# Patient Record
Sex: Male | Born: 1940
Health system: Southern US, Community
[De-identification: ages and names within clinical notes are randomized; demographics above are authoritative.]

## PROBLEM LIST (undated history)

## (undated) DIAGNOSIS — I739 Peripheral vascular disease, unspecified: Secondary | ICD-10-CM

## (undated) DIAGNOSIS — I10 Essential (primary) hypertension: Secondary | ICD-10-CM

## (undated) HISTORY — DX: Peripheral vascular disease, unspecified: I73.9

## (undated) HISTORY — PX: TONSILLECTOMY: SUR1361

---

## 1999-01-22 ENCOUNTER — Emergency Department (HOSPITAL_COMMUNITY): Admission: EM | Admit: 1999-01-22 | Discharge: 1999-01-22 | Payer: Self-pay | Admitting: Emergency Medicine

## 2004-05-16 ENCOUNTER — Ambulatory Visit (HOSPITAL_COMMUNITY): Admission: RE | Admit: 2004-05-16 | Discharge: 2004-05-16 | Payer: Self-pay | Admitting: Gastroenterology

## 2011-02-06 ENCOUNTER — Emergency Department (HOSPITAL_COMMUNITY)
Admission: EM | Admit: 2011-02-06 | Discharge: 2011-02-06 | Disposition: A | Discharge status: Home / Self Care | Payer: Medicare Other | Attending: Emergency Medicine | Admitting: Emergency Medicine

## 2011-02-06 ENCOUNTER — Telehealth: Payer: Self-pay | Admitting: Internal Medicine

## 2011-02-06 DIAGNOSIS — R1310 Dysphagia, unspecified: Secondary | ICD-10-CM | POA: Insufficient documentation

## 2011-02-06 DIAGNOSIS — K2980 Duodenitis without bleeding: Secondary | ICD-10-CM | POA: Insufficient documentation

## 2011-02-06 DIAGNOSIS — IMO0002 Reserved for concepts with insufficient information to code with codable children: Secondary | ICD-10-CM | POA: Insufficient documentation

## 2011-02-06 DIAGNOSIS — I10 Essential (primary) hypertension: Secondary | ICD-10-CM | POA: Insufficient documentation

## 2011-02-06 DIAGNOSIS — T18108A Unspecified foreign body in esophagus causing other injury, initial encounter: Secondary | ICD-10-CM | POA: Insufficient documentation

## 2011-02-06 LAB — POCT I-STAT, CHEM 8
BUN: 25 mg/dL — ABNORMAL HIGH (ref 6–23)
Calcium, Ion: 1.12 mmol/L (ref 1.12–1.32)
Chloride: 109 mEq/L (ref 96–112)
Creatinine, Ser: 1.2 mg/dL (ref 0.50–1.35)
Glucose, Bld: 106 mg/dL — ABNORMAL HIGH (ref 70–99)
HCT: 44 % (ref 39.0–52.0)
Hemoglobin: 15 g/dL (ref 13.0–17.0)
Potassium: 4.4 mEq/L (ref 3.5–5.1)
Sodium: 143 mEq/L (ref 135–145)
TCO2: 27 mmol/L (ref 0–100)

## 2011-02-06 LAB — DIFFERENTIAL
Basophils Absolute: 0.1 10*3/uL (ref 0.0–0.1)
Basophils Relative: 1 % (ref 0–1)
Eosinophils Absolute: 0.2 10*3/uL (ref 0.0–0.7)
Eosinophils Relative: 2 % (ref 0–5)
Lymphocytes Relative: 14 % (ref 12–46)
Lymphs Abs: 1.2 10*3/uL (ref 0.7–4.0)
Monocytes Absolute: 0.6 10*3/uL (ref 0.1–1.0)
Monocytes Relative: 6 % (ref 3–12)
Neutro Abs: 6.6 10*3/uL (ref 1.7–7.7)
Neutrophils Relative %: 77 % (ref 43–77)

## 2011-02-06 LAB — CBC
HCT: 41.2 % (ref 39.0–52.0)
Hemoglobin: 14.5 g/dL (ref 13.0–17.0)
MCH: 32.4 pg (ref 26.0–34.0)
MCHC: 35.2 g/dL (ref 30.0–36.0)
MCV: 92.2 fL (ref 78.0–100.0)
Platelets: 208 10*3/uL (ref 150–400)
RBC: 4.47 MIL/uL (ref 4.22–5.81)
RDW: 14.4 % (ref 11.5–15.5)
WBC: 8.6 10*3/uL (ref 4.0–10.5)

## 2011-02-06 LAB — PROTIME-INR: Prothrombin Time: 12.7 seconds (ref 11.6–15.2)

## 2011-02-06 NOTE — Telephone Encounter (Signed)
Pt is complaining of having food stuck in his esophagus. Pt was seen in 2005 by Dr. Dorena Cookey with [facility]. Spoke with pts wife and let her know he had not been seen by one of our physicians but by Dr. Madilyn Fireman. Gave wife the phone number for [facility]. She states she will call their office.

## 2011-02-21 ENCOUNTER — Ambulatory Visit (HOSPITAL_COMMUNITY)
Admission: RE | Admit: 2011-02-21 | Discharge: 2011-02-21 | Disposition: A | Discharge status: Home / Self Care | Payer: Medicare Other | Source: Ambulatory Visit | Attending: Gastroenterology | Admitting: Gastroenterology

## 2011-02-21 DIAGNOSIS — R1310 Dysphagia, unspecified: Secondary | ICD-10-CM | POA: Insufficient documentation

## 2011-02-21 DIAGNOSIS — Q393 Congenital stenosis and stricture of esophagus: Secondary | ICD-10-CM | POA: Insufficient documentation

## 2011-02-21 DIAGNOSIS — K222 Esophageal obstruction: Secondary | ICD-10-CM | POA: Insufficient documentation

## 2011-02-21 DIAGNOSIS — K2980 Duodenitis without bleeding: Secondary | ICD-10-CM | POA: Insufficient documentation

## 2011-02-21 DIAGNOSIS — Q391 Atresia of esophagus with tracheo-esophageal fistula: Secondary | ICD-10-CM | POA: Insufficient documentation

## 2011-02-21 NOTE — Op Note (Signed)
  NAME:  Ronald [name], Ronald [name] NO.:  [id number]  MEDICAL RECORD NO.:  [id number]  LOCATION:  [facility]                         FACILITY:  [facility]  PHYSICIAN:  Shirley Friar, MDDATE OF BIRTH:  [birth year]  DATE OF PROCEDURE: DATE OF DISCHARGE:                              OPERATIVE REPORT   INDICATIONS:  Concern for food impaction and a history of dysphagia.  MEDICATIONS:  Fentanyl 75 mcg IV, Versed 7 mg IV.  FINDINGS:  Endoscope was inserted through oropharynx and esophagus intubated.  In the distal esophagus was a benign-appearing distal esophageal ring at GE junction with edema, erythema and ulceration noted, most likely due to trauma from the food bolus which has passed. The remaining part of esophagus was unremarkable.  The endoscope was advanced easily down to the stomach without any resistance into the stomach which was unremarkable.  Retroflexed view of the stomach revealed normal proximal stomach.  Endoscope was straightened advanced to the duodenal bulb which revealed scattered nodularity with erythema consistent with mild to moderate duodenitis.  Endoscope was advanced into the second portion of the duodenum which was unremarkable. Endoscope was withdrawn back into the esophagus and the distal esophageal ring was again reevaluated and the endoscope was withdrawn to confirm above findings.  ASSESSMENT: 1. Distal esophageal ring status post food impaction which cleared     prior to endoscopy. 2. Duodenitis.  PLAN: 1. Start PPI therapy. 2. Follow up endoscopy with dilation in 2 weeks.     Shirley Friar, MD     VCS/MEDQ  D:  02/06/2011  T:  02/07/2011  Job:  [id number]  Electronically Signed by Charlott Rakes MD on 02/21/2011 10:58:54 AM

## 2011-02-23 ENCOUNTER — Encounter (INDEPENDENT_AMBULATORY_CARE_PROVIDER_SITE_OTHER): Payer: Medicare Other | Admitting: Ophthalmology

## 2011-02-23 DIAGNOSIS — H2510 Age-related nuclear cataract, unspecified eye: Secondary | ICD-10-CM

## 2011-02-23 DIAGNOSIS — H3530 Unspecified macular degeneration: Secondary | ICD-10-CM

## 2011-02-23 DIAGNOSIS — H43819 Vitreous degeneration, unspecified eye: Secondary | ICD-10-CM

## 2011-03-19 NOTE — Op Note (Signed)
  NAME:  Ronald [name], Ronald [name] NO.:  [id number]  MEDICAL RECORD NO.:  [id number]  LOCATION:  [facility]                         FACILITY:  [facility]  PHYSICIAN:  Shirley Friar, MDDATE OF BIRTH:  08-May-1941  DATE OF PROCEDURE: DATE OF DISCHARGE:                              OPERATIVE REPORT   PROCEDURE:  Upper endoscopy.  INDICATION:  Dysphagia, recent food impaction, and finding of the distal esophageal ring.  MEDICATIONS:  Fentanyl 75 mcg IV, Versed 8 mg IV, Cetacaine spray x2.  FINDINGS:  Endoscope was inserted through oropharynx and esophagus was intubated which revealed a distal esophageal ring that was partially obstructing at the GE junction which was approximately 42 cm from the incisors.  Endoscope was advanced through this area with mild resistance down into the stomach which revealed normal-appearing gastric mucosa. Retroflexion was done which revealed normal proximal stomach.  Endoscope was straightened and advanced into the duodenal bulb which revealed scattered areas of erythema in the duodenal bulb, consistent with mild duodenitis.  Endoscope was advanced down to the 2nd portion of duodenum which was unremarkable.  Endoscope was withdrawn back into the stomach back to the esophagus where there was evidence of endoscopic trauma at the distal esophageal ring from passage of the endoscope.  An 8 cm long pneumatic balloon through the scope was inserted and inflated to 15 mm and this size was held for 1 minute.  The balloon was deflated and the area was observed and there was evidence of blood at the site of the ring.  The balloon was then inflated to 16.5 mm and held for 1 minute and then the balloon was deflated and removed.  The distal esophageal ring was successfully dilated.  In the middle part of the esophagus was a nonobstructing esophageal web noted.  Endoscope was drawn to conform above findings.  ASSESSMENT: 1. Distal esophageal ring, status post  balloon dilation up to 16.5 mm. 2. Nonobstructing mid esophageal web. 3. Mild duodenitis.  PLAN: 1. Advance diet as tolerated. 2. Continue daily proton pump inhibitor therapy. 3. Follow up in office as needed.     Shirley Friar, MD     VCS/MEDQ  D:  02/21/2011  T:  02/21/2011  Job:  [id number]  cc:   Duncan Dull, M.D. Fax: [phone]  Electronically Signed by Charlott Rakes MD on 03/19/2011 07:52:32 PM

## 2011-05-30 ENCOUNTER — Other Ambulatory Visit: Payer: Self-pay | Admitting: Family Medicine

## 2011-05-30 DIAGNOSIS — Z136 Encounter for screening for cardiovascular disorders: Secondary | ICD-10-CM

## 2011-06-06 ENCOUNTER — Ambulatory Visit
Admission: RE | Admit: 2011-06-06 | Discharge: 2011-06-06 | Disposition: A | Discharge status: Home / Self Care | Payer: Medicare Other | Source: Ambulatory Visit | Attending: Family Medicine | Admitting: Family Medicine

## 2011-06-06 DIAGNOSIS — Z136 Encounter for screening for cardiovascular disorders: Secondary | ICD-10-CM | POA: Diagnosis not present

## 2011-08-09 DIAGNOSIS — Z79899 Other long term (current) drug therapy: Secondary | ICD-10-CM | POA: Diagnosis not present

## 2011-08-09 DIAGNOSIS — R609 Edema, unspecified: Secondary | ICD-10-CM | POA: Diagnosis not present

## 2011-08-09 DIAGNOSIS — R5381 Other malaise: Secondary | ICD-10-CM | POA: Diagnosis not present

## 2011-08-09 DIAGNOSIS — R5383 Other fatigue: Secondary | ICD-10-CM | POA: Diagnosis not present

## 2011-08-09 DIAGNOSIS — B351 Tinea unguium: Secondary | ICD-10-CM | POA: Diagnosis not present

## 2011-11-15 DIAGNOSIS — H2510 Age-related nuclear cataract, unspecified eye: Secondary | ICD-10-CM | POA: Diagnosis not present

## 2011-11-15 DIAGNOSIS — H40059 Ocular hypertension, unspecified eye: Secondary | ICD-10-CM | POA: Diagnosis not present

## 2011-11-15 DIAGNOSIS — H04129 Dry eye syndrome of unspecified lacrimal gland: Secondary | ICD-10-CM | POA: Diagnosis not present

## 2012-02-22 ENCOUNTER — Encounter (INDEPENDENT_AMBULATORY_CARE_PROVIDER_SITE_OTHER): Payer: Medicare Other | Admitting: Ophthalmology

## 2012-02-25 ENCOUNTER — Encounter (INDEPENDENT_AMBULATORY_CARE_PROVIDER_SITE_OTHER): Payer: Medicare Other | Admitting: Ophthalmology

## 2012-03-03 ENCOUNTER — Encounter (INDEPENDENT_AMBULATORY_CARE_PROVIDER_SITE_OTHER): Payer: Medicare Other | Admitting: Ophthalmology

## 2012-03-18 DIAGNOSIS — Z23 Encounter for immunization: Secondary | ICD-10-CM | POA: Diagnosis not present

## 2012-05-15 DIAGNOSIS — H04129 Dry eye syndrome of unspecified lacrimal gland: Secondary | ICD-10-CM | POA: Diagnosis not present

## 2012-05-15 DIAGNOSIS — H2510 Age-related nuclear cataract, unspecified eye: Secondary | ICD-10-CM | POA: Diagnosis not present

## 2012-05-15 DIAGNOSIS — H3530 Unspecified macular degeneration: Secondary | ICD-10-CM | POA: Diagnosis not present

## 2012-05-15 DIAGNOSIS — H40059 Ocular hypertension, unspecified eye: Secondary | ICD-10-CM | POA: Diagnosis not present

## 2012-06-26 DIAGNOSIS — H35369 Drusen (degenerative) of macula, unspecified eye: Secondary | ICD-10-CM | POA: Diagnosis not present

## 2012-06-26 DIAGNOSIS — H40059 Ocular hypertension, unspecified eye: Secondary | ICD-10-CM | POA: Diagnosis not present

## 2012-06-26 DIAGNOSIS — H04129 Dry eye syndrome of unspecified lacrimal gland: Secondary | ICD-10-CM | POA: Diagnosis not present

## 2012-06-26 DIAGNOSIS — H2510 Age-related nuclear cataract, unspecified eye: Secondary | ICD-10-CM | POA: Diagnosis not present

## 2012-07-28 DIAGNOSIS — L570 Actinic keratosis: Secondary | ICD-10-CM | POA: Diagnosis not present

## 2012-07-28 DIAGNOSIS — L538 Other specified erythematous conditions: Secondary | ICD-10-CM | POA: Diagnosis not present

## 2012-07-28 DIAGNOSIS — B351 Tinea unguium: Secondary | ICD-10-CM | POA: Diagnosis not present

## 2012-07-28 DIAGNOSIS — D235 Other benign neoplasm of skin of trunk: Secondary | ICD-10-CM | POA: Diagnosis not present

## 2012-08-07 DIAGNOSIS — H04129 Dry eye syndrome of unspecified lacrimal gland: Secondary | ICD-10-CM | POA: Diagnosis not present

## 2012-08-07 DIAGNOSIS — H2510 Age-related nuclear cataract, unspecified eye: Secondary | ICD-10-CM | POA: Diagnosis not present

## 2012-08-07 DIAGNOSIS — H40059 Ocular hypertension, unspecified eye: Secondary | ICD-10-CM | POA: Diagnosis not present

## 2012-12-22 DIAGNOSIS — L259 Unspecified contact dermatitis, unspecified cause: Secondary | ICD-10-CM | POA: Diagnosis not present

## 2012-12-22 DIAGNOSIS — M79609 Pain in unspecified limb: Secondary | ICD-10-CM | POA: Diagnosis not present

## 2012-12-22 DIAGNOSIS — N529 Male erectile dysfunction, unspecified: Secondary | ICD-10-CM | POA: Diagnosis not present

## 2012-12-22 DIAGNOSIS — I10 Essential (primary) hypertension: Secondary | ICD-10-CM | POA: Diagnosis not present

## 2013-02-12 DIAGNOSIS — H40059 Ocular hypertension, unspecified eye: Secondary | ICD-10-CM | POA: Diagnosis not present

## 2013-02-12 DIAGNOSIS — H2510 Age-related nuclear cataract, unspecified eye: Secondary | ICD-10-CM | POA: Diagnosis not present

## 2013-02-12 DIAGNOSIS — H353190 Nonexudative age-related macular degeneration, unspecified eye, stage unspecified: Secondary | ICD-10-CM | POA: Diagnosis not present

## 2013-02-27 ENCOUNTER — Ambulatory Visit (INDEPENDENT_AMBULATORY_CARE_PROVIDER_SITE_OTHER): Payer: Medicare Other | Admitting: Ophthalmology

## 2013-02-27 DIAGNOSIS — H3530 Unspecified macular degeneration: Secondary | ICD-10-CM

## 2013-02-27 DIAGNOSIS — H2510 Age-related nuclear cataract, unspecified eye: Secondary | ICD-10-CM

## 2013-02-27 DIAGNOSIS — I10 Essential (primary) hypertension: Secondary | ICD-10-CM | POA: Diagnosis not present

## 2013-02-27 DIAGNOSIS — H35039 Hypertensive retinopathy, unspecified eye: Secondary | ICD-10-CM | POA: Diagnosis not present

## 2013-02-27 DIAGNOSIS — H43819 Vitreous degeneration, unspecified eye: Secondary | ICD-10-CM | POA: Diagnosis not present

## 2013-03-05 DIAGNOSIS — Z23 Encounter for immunization: Secondary | ICD-10-CM | POA: Diagnosis not present

## 2013-05-18 DIAGNOSIS — I872 Venous insufficiency (chronic) (peripheral): Secondary | ICD-10-CM | POA: Diagnosis not present

## 2013-05-18 DIAGNOSIS — D235 Other benign neoplasm of skin of trunk: Secondary | ICD-10-CM | POA: Diagnosis not present

## 2013-05-18 DIAGNOSIS — L259 Unspecified contact dermatitis, unspecified cause: Secondary | ICD-10-CM | POA: Diagnosis not present

## 2013-08-13 DIAGNOSIS — H40059 Ocular hypertension, unspecified eye: Secondary | ICD-10-CM | POA: Diagnosis not present

## 2013-08-13 DIAGNOSIS — H2510 Age-related nuclear cataract, unspecified eye: Secondary | ICD-10-CM | POA: Diagnosis not present

## 2013-08-13 DIAGNOSIS — H353190 Nonexudative age-related macular degeneration, unspecified eye, stage unspecified: Secondary | ICD-10-CM | POA: Diagnosis not present

## 2013-09-11 IMAGING — US US AORTA SCREENING (MEDICARE)
1 series · 14 of 14 positions shown · non-contrast
Comparison: None.

CLINICAL DATA: Screening examination.  Hypertension.

ULTRASOUND OF ABDOMINAL AORTA
TECHNIQUE: Ultrasound examination of the abdominal aorta was
performed to evaluate for abdominal aortic aneurysm.

[Series 1: us aorta screening (medicare) · 0.35mm/px · 14 of 14 slices shown]
[im 1/14]
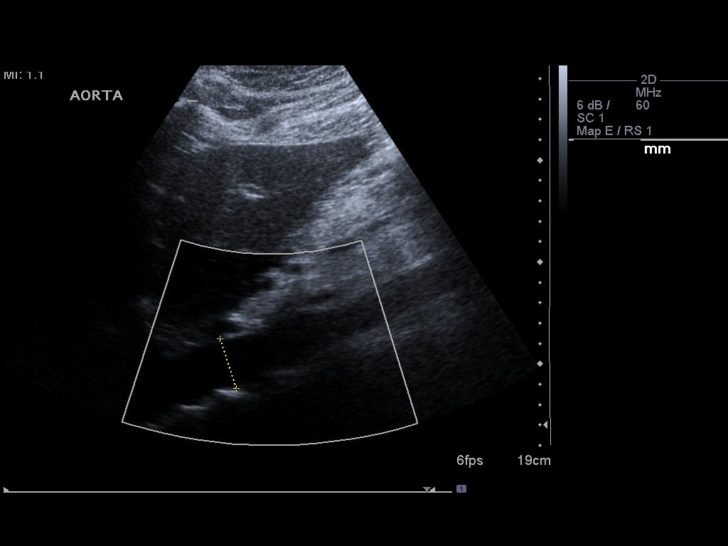
[im 2/14]
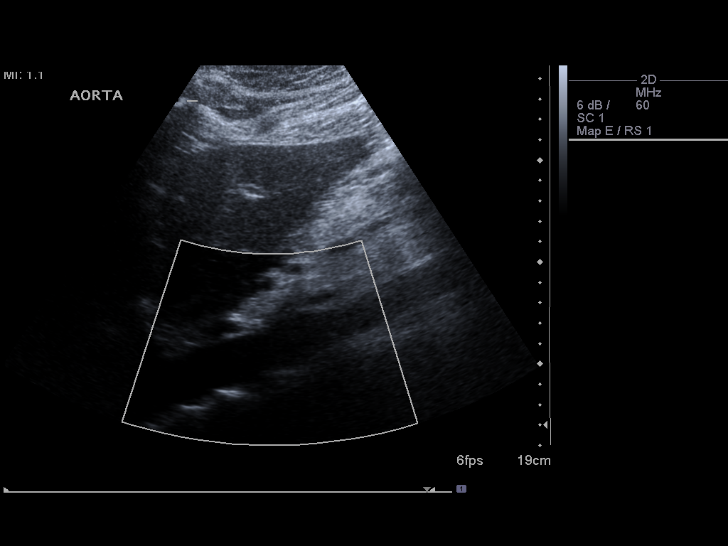
[im 3/14]
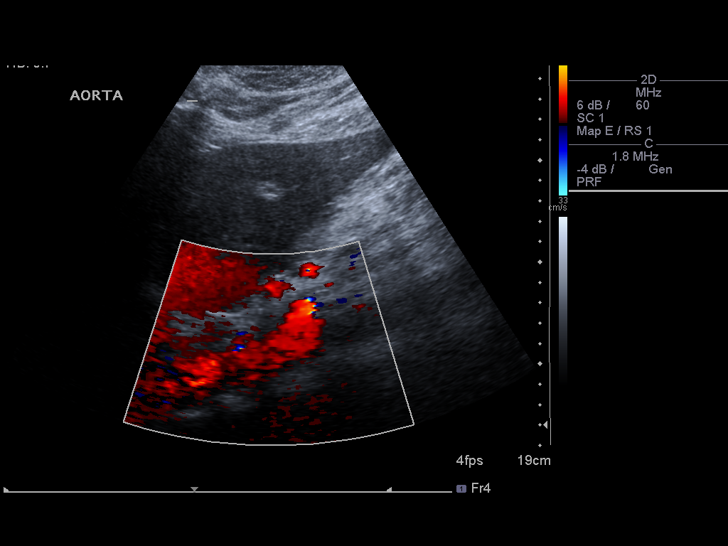
[im 4/14]
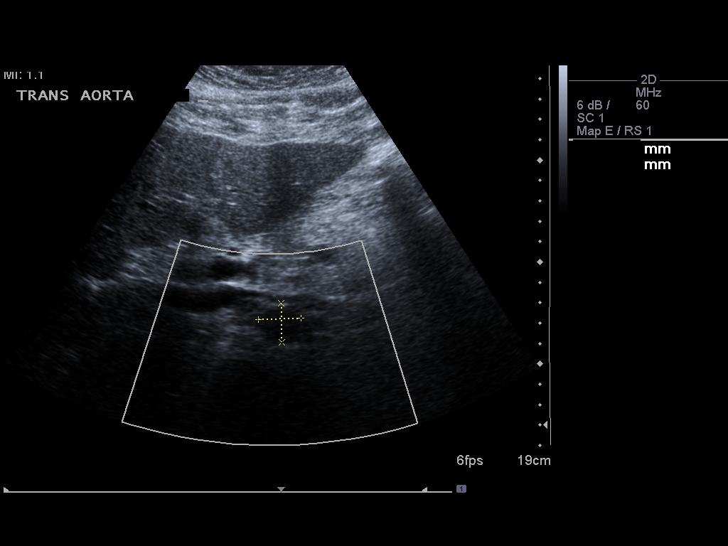
[im 5/14]
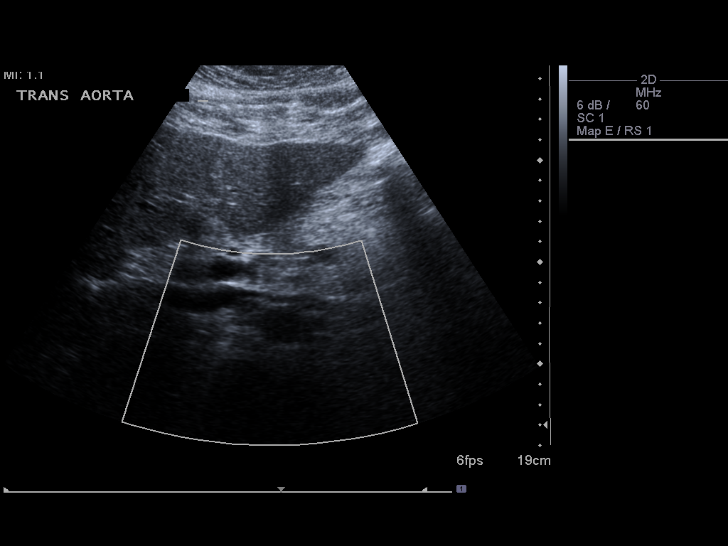
[im 6/14]
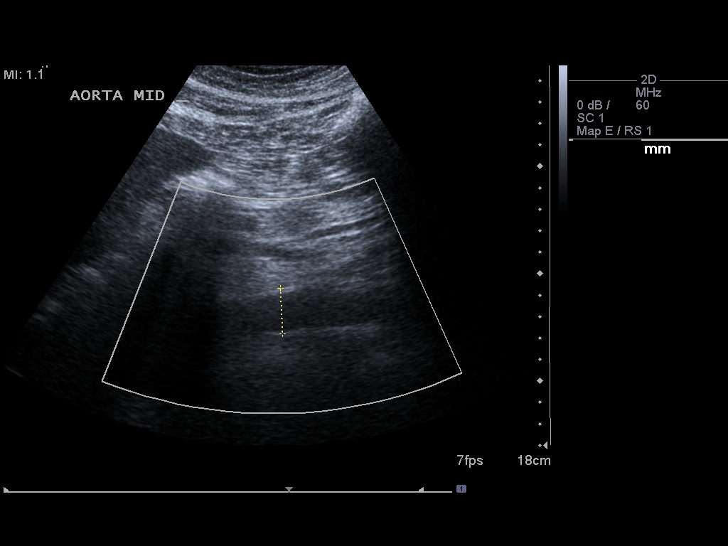
[im 7/14]
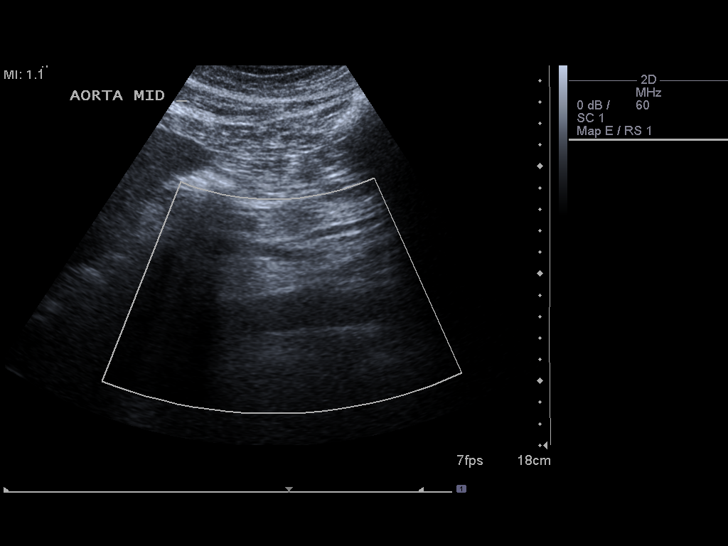
[im 8/14]
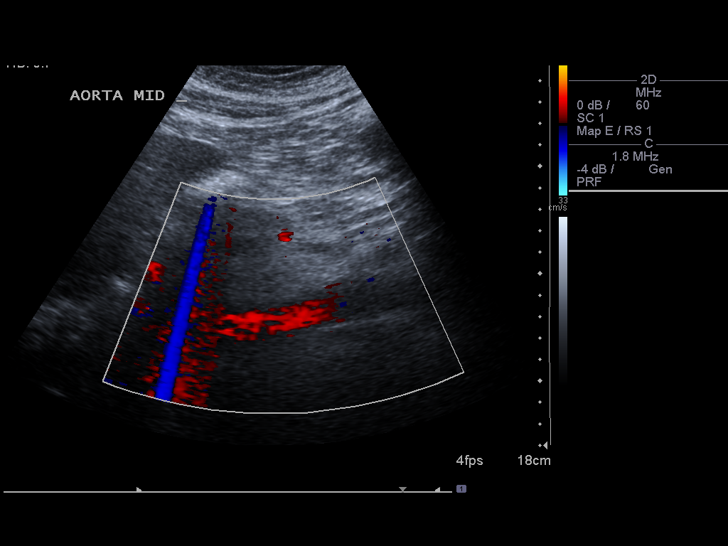
[im 9/14]
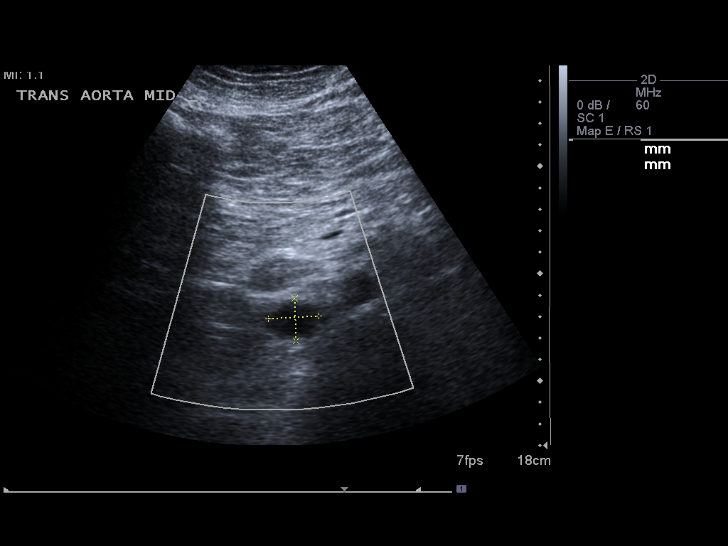
[im 10/14]
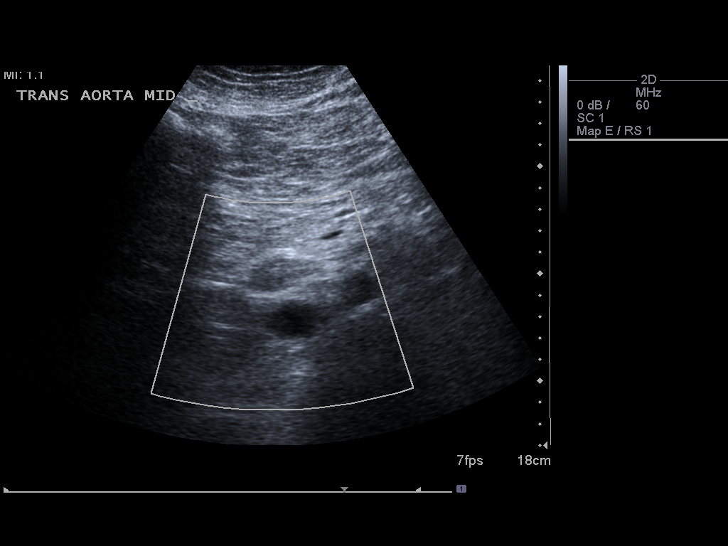
[im 11/14]
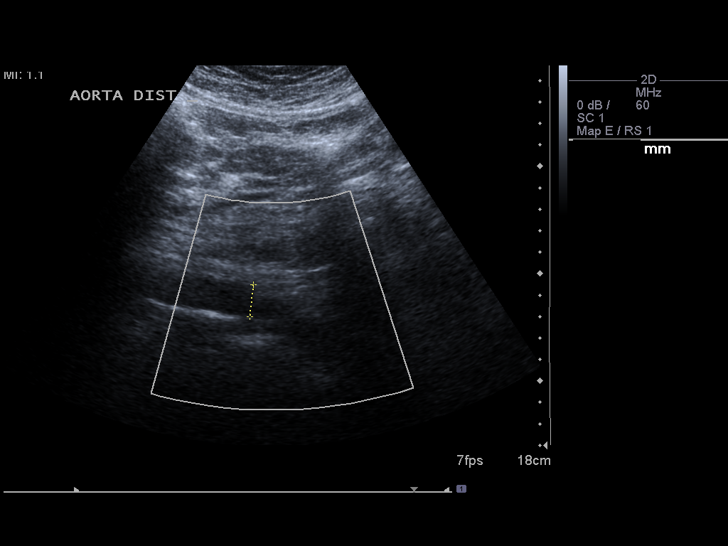
[im 12/14]
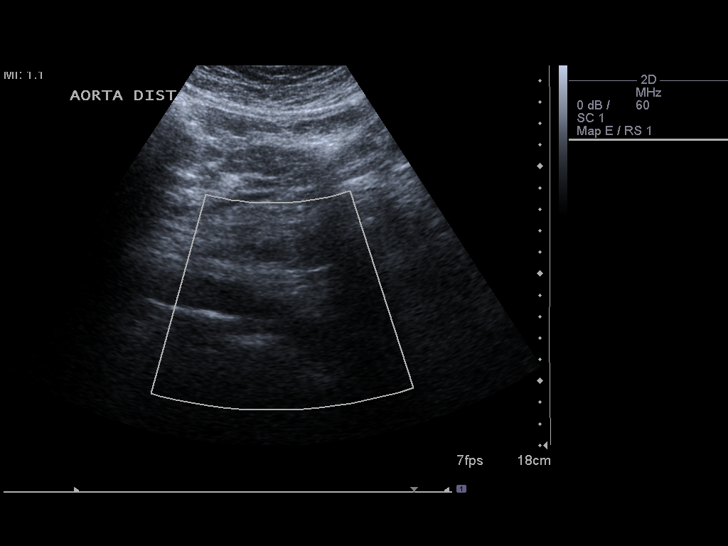
[im 13/14]
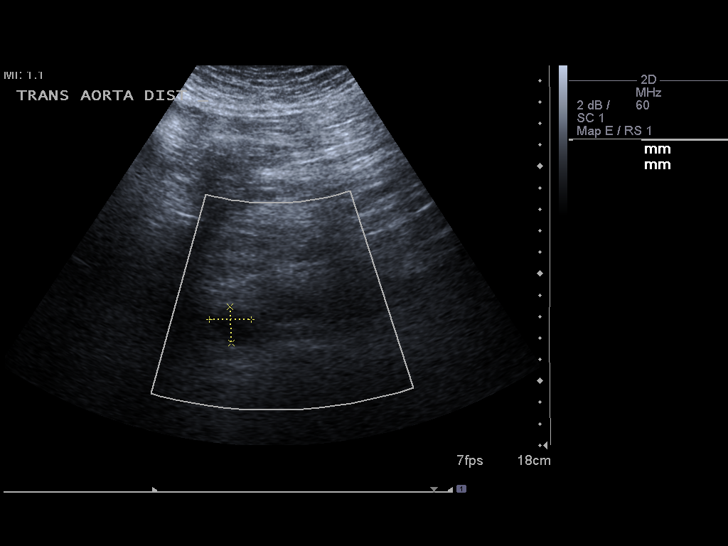
[im 14/14]
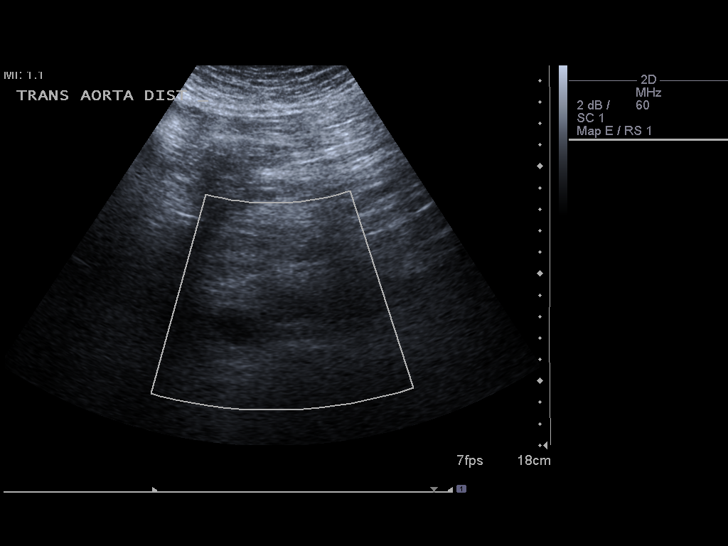

[14 of 14 positions shown; findings below may reference images not displayed]

Abdominal Aorta:  No aneurysm identified.

      Maximum AP diameter:  2.6 cm.
      Maximum TRV diameter:  2.1 cm.
IMPRESSION: No abdominal aortic aneurysm identified.

## 2014-02-15 DIAGNOSIS — Z125 Encounter for screening for malignant neoplasm of prostate: Secondary | ICD-10-CM | POA: Diagnosis not present

## 2014-02-15 DIAGNOSIS — Z136 Encounter for screening for cardiovascular disorders: Secondary | ICD-10-CM | POA: Diagnosis not present

## 2014-02-15 DIAGNOSIS — I10 Essential (primary) hypertension: Secondary | ICD-10-CM | POA: Diagnosis not present

## 2014-02-15 DIAGNOSIS — Z23 Encounter for immunization: Secondary | ICD-10-CM | POA: Diagnosis not present

## 2014-02-15 DIAGNOSIS — Z0000 Encounter for general adult medical examination without abnormal findings: Secondary | ICD-10-CM | POA: Diagnosis not present

## 2014-02-15 DIAGNOSIS — Z1211 Encounter for screening for malignant neoplasm of colon: Secondary | ICD-10-CM | POA: Diagnosis not present

## 2014-02-16 DIAGNOSIS — Z23 Encounter for immunization: Secondary | ICD-10-CM | POA: Diagnosis not present

## 2014-02-18 DIAGNOSIS — H40059 Ocular hypertension, unspecified eye: Secondary | ICD-10-CM | POA: Diagnosis not present

## 2014-02-18 DIAGNOSIS — H2510 Age-related nuclear cataract, unspecified eye: Secondary | ICD-10-CM | POA: Diagnosis not present

## 2014-02-18 DIAGNOSIS — H353190 Nonexudative age-related macular degeneration, unspecified eye, stage unspecified: Secondary | ICD-10-CM | POA: Diagnosis not present

## 2014-03-12 ENCOUNTER — Ambulatory Visit (INDEPENDENT_AMBULATORY_CARE_PROVIDER_SITE_OTHER): Payer: Medicare Other | Admitting: Ophthalmology

## 2014-03-12 DIAGNOSIS — I10 Essential (primary) hypertension: Secondary | ICD-10-CM

## 2014-03-12 DIAGNOSIS — H3531 Nonexudative age-related macular degeneration: Secondary | ICD-10-CM

## 2014-03-12 DIAGNOSIS — H35033 Hypertensive retinopathy, bilateral: Secondary | ICD-10-CM

## 2014-03-12 DIAGNOSIS — H43813 Vitreous degeneration, bilateral: Secondary | ICD-10-CM

## 2014-03-15 DIAGNOSIS — H6120 Impacted cerumen, unspecified ear: Secondary | ICD-10-CM | POA: Diagnosis not present

## 2014-03-15 DIAGNOSIS — L309 Dermatitis, unspecified: Secondary | ICD-10-CM | POA: Diagnosis not present

## 2014-03-22 DIAGNOSIS — B351 Tinea unguium: Secondary | ICD-10-CM | POA: Diagnosis not present

## 2014-03-22 DIAGNOSIS — Z1283 Encounter for screening for malignant neoplasm of skin: Secondary | ICD-10-CM | POA: Diagnosis not present

## 2014-03-22 DIAGNOSIS — L570 Actinic keratosis: Secondary | ICD-10-CM | POA: Diagnosis not present

## 2014-03-22 DIAGNOSIS — L259 Unspecified contact dermatitis, unspecified cause: Secondary | ICD-10-CM | POA: Diagnosis not present

## 2014-03-22 DIAGNOSIS — X32XXXD Exposure to sunlight, subsequent encounter: Secondary | ICD-10-CM | POA: Diagnosis not present

## 2014-05-31 DIAGNOSIS — D120 Benign neoplasm of cecum: Secondary | ICD-10-CM | POA: Diagnosis not present

## 2014-05-31 DIAGNOSIS — Z1211 Encounter for screening for malignant neoplasm of colon: Secondary | ICD-10-CM | POA: Diagnosis not present

## 2014-05-31 DIAGNOSIS — D126 Benign neoplasm of colon, unspecified: Secondary | ICD-10-CM | POA: Diagnosis not present

## 2014-08-25 DIAGNOSIS — H3531 Nonexudative age-related macular degeneration: Secondary | ICD-10-CM | POA: Diagnosis not present

## 2014-08-25 DIAGNOSIS — H02834 Dermatochalasis of left upper eyelid: Secondary | ICD-10-CM | POA: Diagnosis not present

## 2014-08-25 DIAGNOSIS — H25813 Combined forms of age-related cataract, bilateral: Secondary | ICD-10-CM | POA: Diagnosis not present

## 2014-08-25 DIAGNOSIS — H02831 Dermatochalasis of right upper eyelid: Secondary | ICD-10-CM | POA: Diagnosis not present

## 2014-08-25 DIAGNOSIS — H40053 Ocular hypertension, bilateral: Secondary | ICD-10-CM | POA: Diagnosis not present

## 2014-09-13 DIAGNOSIS — H2512 Age-related nuclear cataract, left eye: Secondary | ICD-10-CM | POA: Diagnosis not present

## 2014-09-13 DIAGNOSIS — H3531 Nonexudative age-related macular degeneration: Secondary | ICD-10-CM | POA: Diagnosis not present

## 2014-09-13 DIAGNOSIS — H25812 Combined forms of age-related cataract, left eye: Secondary | ICD-10-CM | POA: Diagnosis not present

## 2014-09-21 DIAGNOSIS — H2511 Age-related nuclear cataract, right eye: Secondary | ICD-10-CM | POA: Diagnosis not present

## 2014-09-27 DIAGNOSIS — H2511 Age-related nuclear cataract, right eye: Secondary | ICD-10-CM | POA: Diagnosis not present

## 2014-09-27 DIAGNOSIS — H269 Unspecified cataract: Secondary | ICD-10-CM | POA: Diagnosis not present

## 2014-10-06 DIAGNOSIS — H3531 Nonexudative age-related macular degeneration: Secondary | ICD-10-CM | POA: Diagnosis not present

## 2014-11-10 DIAGNOSIS — H3531 Nonexudative age-related macular degeneration: Secondary | ICD-10-CM | POA: Diagnosis not present

## 2014-11-10 DIAGNOSIS — H26493 Other secondary cataract, bilateral: Secondary | ICD-10-CM | POA: Diagnosis not present

## 2014-12-22 DIAGNOSIS — H40053 Ocular hypertension, bilateral: Secondary | ICD-10-CM | POA: Diagnosis not present

## 2014-12-22 DIAGNOSIS — Z961 Presence of intraocular lens: Secondary | ICD-10-CM | POA: Diagnosis not present

## 2014-12-22 DIAGNOSIS — H3531 Nonexudative age-related macular degeneration: Secondary | ICD-10-CM | POA: Diagnosis not present

## 2015-02-21 DIAGNOSIS — Z136 Encounter for screening for cardiovascular disorders: Secondary | ICD-10-CM | POA: Diagnosis not present

## 2015-02-21 DIAGNOSIS — Z23 Encounter for immunization: Secondary | ICD-10-CM | POA: Diagnosis not present

## 2015-02-21 DIAGNOSIS — I10 Essential (primary) hypertension: Secondary | ICD-10-CM | POA: Diagnosis not present

## 2015-02-21 DIAGNOSIS — Z0000 Encounter for general adult medical examination without abnormal findings: Secondary | ICD-10-CM | POA: Diagnosis not present

## 2015-03-10 DIAGNOSIS — I10 Essential (primary) hypertension: Secondary | ICD-10-CM | POA: Diagnosis not present

## 2015-04-04 ENCOUNTER — Ambulatory Visit (INDEPENDENT_AMBULATORY_CARE_PROVIDER_SITE_OTHER): Payer: Medicare Other | Admitting: Ophthalmology

## 2015-04-04 DIAGNOSIS — H43813 Vitreous degeneration, bilateral: Secondary | ICD-10-CM

## 2015-04-04 DIAGNOSIS — H35033 Hypertensive retinopathy, bilateral: Secondary | ICD-10-CM | POA: Diagnosis not present

## 2015-04-04 DIAGNOSIS — H353134 Nonexudative age-related macular degeneration, bilateral, advanced atrophic with subfoveal involvement: Secondary | ICD-10-CM | POA: Diagnosis not present

## 2015-04-04 DIAGNOSIS — I10 Essential (primary) hypertension: Secondary | ICD-10-CM | POA: Diagnosis not present

## 2015-05-16 ENCOUNTER — Encounter (INDEPENDENT_AMBULATORY_CARE_PROVIDER_SITE_OTHER): Payer: Medicare Other | Admitting: Ophthalmology

## 2015-05-16 DIAGNOSIS — I10 Essential (primary) hypertension: Secondary | ICD-10-CM | POA: Diagnosis not present

## 2015-05-16 DIAGNOSIS — H59033 Cystoid macular edema following cataract surgery, bilateral: Secondary | ICD-10-CM | POA: Diagnosis not present

## 2015-05-16 DIAGNOSIS — H35033 Hypertensive retinopathy, bilateral: Secondary | ICD-10-CM | POA: Diagnosis not present

## 2015-05-16 DIAGNOSIS — H43813 Vitreous degeneration, bilateral: Secondary | ICD-10-CM

## 2015-05-16 DIAGNOSIS — H353134 Nonexudative age-related macular degeneration, bilateral, advanced atrophic with subfoveal involvement: Secondary | ICD-10-CM | POA: Diagnosis not present

## 2015-05-18 ENCOUNTER — Encounter (INDEPENDENT_AMBULATORY_CARE_PROVIDER_SITE_OTHER): Payer: Medicare Other | Admitting: Ophthalmology

## 2015-06-14 DIAGNOSIS — H353111 Nonexudative age-related macular degeneration, right eye, early dry stage: Secondary | ICD-10-CM | POA: Diagnosis not present

## 2015-06-14 DIAGNOSIS — H02834 Dermatochalasis of left upper eyelid: Secondary | ICD-10-CM | POA: Diagnosis not present

## 2015-06-14 DIAGNOSIS — H40053 Ocular hypertension, bilateral: Secondary | ICD-10-CM | POA: Diagnosis not present

## 2015-06-14 DIAGNOSIS — H04123 Dry eye syndrome of bilateral lacrimal glands: Secondary | ICD-10-CM | POA: Diagnosis not present

## 2015-06-14 DIAGNOSIS — H353122 Nonexudative age-related macular degeneration, left eye, intermediate dry stage: Secondary | ICD-10-CM | POA: Diagnosis not present

## 2015-06-14 DIAGNOSIS — Z961 Presence of intraocular lens: Secondary | ICD-10-CM | POA: Diagnosis not present

## 2015-06-14 DIAGNOSIS — H02831 Dermatochalasis of right upper eyelid: Secondary | ICD-10-CM | POA: Diagnosis not present

## 2015-06-22 DIAGNOSIS — Z1283 Encounter for screening for malignant neoplasm of skin: Secondary | ICD-10-CM | POA: Diagnosis not present

## 2015-06-22 DIAGNOSIS — X32XXXD Exposure to sunlight, subsequent encounter: Secondary | ICD-10-CM | POA: Diagnosis not present

## 2015-06-22 DIAGNOSIS — L219 Seborrheic dermatitis, unspecified: Secondary | ICD-10-CM | POA: Diagnosis not present

## 2015-06-22 DIAGNOSIS — L570 Actinic keratosis: Secondary | ICD-10-CM | POA: Diagnosis not present

## 2015-11-18 ENCOUNTER — Ambulatory Visit (INDEPENDENT_AMBULATORY_CARE_PROVIDER_SITE_OTHER): Payer: Medicare Other | Admitting: Ophthalmology

## 2015-12-27 DIAGNOSIS — H353122 Nonexudative age-related macular degeneration, left eye, intermediate dry stage: Secondary | ICD-10-CM | POA: Diagnosis not present

## 2015-12-27 DIAGNOSIS — H40053 Ocular hypertension, bilateral: Secondary | ICD-10-CM | POA: Diagnosis not present

## 2015-12-27 DIAGNOSIS — H04213 Epiphora due to excess lacrimation, bilateral lacrimal glands: Secondary | ICD-10-CM | POA: Diagnosis not present

## 2015-12-27 DIAGNOSIS — H04123 Dry eye syndrome of bilateral lacrimal glands: Secondary | ICD-10-CM | POA: Diagnosis not present

## 2015-12-27 DIAGNOSIS — Z961 Presence of intraocular lens: Secondary | ICD-10-CM | POA: Diagnosis not present

## 2015-12-27 DIAGNOSIS — H353111 Nonexudative age-related macular degeneration, right eye, early dry stage: Secondary | ICD-10-CM | POA: Diagnosis not present

## 2016-03-05 DIAGNOSIS — Z23 Encounter for immunization: Secondary | ICD-10-CM | POA: Diagnosis not present

## 2016-03-05 DIAGNOSIS — Z6841 Body Mass Index (BMI) 40.0 and over, adult: Secondary | ICD-10-CM | POA: Diagnosis not present

## 2016-03-05 DIAGNOSIS — Z0000 Encounter for general adult medical examination without abnormal findings: Secondary | ICD-10-CM | POA: Diagnosis not present

## 2016-03-05 DIAGNOSIS — E785 Hyperlipidemia, unspecified: Secondary | ICD-10-CM | POA: Diagnosis not present

## 2016-03-05 DIAGNOSIS — I10 Essential (primary) hypertension: Secondary | ICD-10-CM | POA: Diagnosis not present

## 2016-05-17 ENCOUNTER — Ambulatory Visit (INDEPENDENT_AMBULATORY_CARE_PROVIDER_SITE_OTHER): Payer: Medicare Other | Admitting: Ophthalmology

## 2016-05-18 ENCOUNTER — Ambulatory Visit (INDEPENDENT_AMBULATORY_CARE_PROVIDER_SITE_OTHER): Payer: Medicare Other | Admitting: Ophthalmology

## 2016-05-18 DIAGNOSIS — H35033 Hypertensive retinopathy, bilateral: Secondary | ICD-10-CM | POA: Diagnosis not present

## 2016-05-18 DIAGNOSIS — H43813 Vitreous degeneration, bilateral: Secondary | ICD-10-CM | POA: Diagnosis not present

## 2016-05-18 DIAGNOSIS — H353134 Nonexudative age-related macular degeneration, bilateral, advanced atrophic with subfoveal involvement: Secondary | ICD-10-CM | POA: Diagnosis not present

## 2016-05-18 DIAGNOSIS — I10 Essential (primary) hypertension: Secondary | ICD-10-CM | POA: Diagnosis not present

## 2016-07-23 DIAGNOSIS — L570 Actinic keratosis: Secondary | ICD-10-CM | POA: Diagnosis not present

## 2016-07-23 DIAGNOSIS — L258 Unspecified contact dermatitis due to other agents: Secondary | ICD-10-CM | POA: Diagnosis not present

## 2016-07-23 DIAGNOSIS — X32XXXD Exposure to sunlight, subsequent encounter: Secondary | ICD-10-CM | POA: Diagnosis not present

## 2016-07-23 DIAGNOSIS — L708 Other acne: Secondary | ICD-10-CM | POA: Diagnosis not present

## 2016-07-23 DIAGNOSIS — L304 Erythema intertrigo: Secondary | ICD-10-CM | POA: Diagnosis not present

## 2016-07-23 DIAGNOSIS — L820 Inflamed seborrheic keratosis: Secondary | ICD-10-CM | POA: Diagnosis not present

## 2016-11-15 DIAGNOSIS — H353132 Nonexudative age-related macular degeneration, bilateral, intermediate dry stage: Secondary | ICD-10-CM | POA: Diagnosis not present

## 2016-11-15 DIAGNOSIS — H40053 Ocular hypertension, bilateral: Secondary | ICD-10-CM | POA: Diagnosis not present

## 2017-03-11 DIAGNOSIS — E785 Hyperlipidemia, unspecified: Secondary | ICD-10-CM | POA: Diagnosis not present

## 2017-03-11 DIAGNOSIS — Z136 Encounter for screening for cardiovascular disorders: Secondary | ICD-10-CM | POA: Diagnosis not present

## 2017-03-11 DIAGNOSIS — I10 Essential (primary) hypertension: Secondary | ICD-10-CM | POA: Diagnosis not present

## 2017-03-11 DIAGNOSIS — Z23 Encounter for immunization: Secondary | ICD-10-CM | POA: Diagnosis not present

## 2017-03-11 DIAGNOSIS — Z0000 Encounter for general adult medical examination without abnormal findings: Secondary | ICD-10-CM | POA: Diagnosis not present

## 2017-03-11 DIAGNOSIS — Z6838 Body mass index (BMI) 38.0-38.9, adult: Secondary | ICD-10-CM | POA: Diagnosis not present

## 2017-05-15 ENCOUNTER — Ambulatory Visit (INDEPENDENT_AMBULATORY_CARE_PROVIDER_SITE_OTHER): Payer: Medicare Other | Admitting: Ophthalmology

## 2017-05-15 DIAGNOSIS — H35033 Hypertensive retinopathy, bilateral: Secondary | ICD-10-CM

## 2017-05-15 DIAGNOSIS — H353134 Nonexudative age-related macular degeneration, bilateral, advanced atrophic with subfoveal involvement: Secondary | ICD-10-CM | POA: Diagnosis not present

## 2017-05-15 DIAGNOSIS — I10 Essential (primary) hypertension: Secondary | ICD-10-CM

## 2017-05-15 DIAGNOSIS — H43813 Vitreous degeneration, bilateral: Secondary | ICD-10-CM | POA: Diagnosis not present

## 2017-07-22 DIAGNOSIS — X32XXXD Exposure to sunlight, subsequent encounter: Secondary | ICD-10-CM | POA: Diagnosis not present

## 2017-07-22 DIAGNOSIS — L821 Other seborrheic keratosis: Secondary | ICD-10-CM | POA: Diagnosis not present

## 2017-07-22 DIAGNOSIS — L304 Erythema intertrigo: Secondary | ICD-10-CM | POA: Diagnosis not present

## 2017-07-22 DIAGNOSIS — I872 Venous insufficiency (chronic) (peripheral): Secondary | ICD-10-CM | POA: Diagnosis not present

## 2017-07-22 DIAGNOSIS — L570 Actinic keratosis: Secondary | ICD-10-CM | POA: Diagnosis not present

## 2017-07-22 DIAGNOSIS — Z1283 Encounter for screening for malignant neoplasm of skin: Secondary | ICD-10-CM | POA: Diagnosis not present

## 2017-12-10 DIAGNOSIS — H353111 Nonexudative age-related macular degeneration, right eye, early dry stage: Secondary | ICD-10-CM | POA: Diagnosis not present

## 2017-12-10 DIAGNOSIS — Z961 Presence of intraocular lens: Secondary | ICD-10-CM | POA: Diagnosis not present

## 2017-12-10 DIAGNOSIS — H04123 Dry eye syndrome of bilateral lacrimal glands: Secondary | ICD-10-CM | POA: Diagnosis not present

## 2017-12-10 DIAGNOSIS — H353122 Nonexudative age-related macular degeneration, left eye, intermediate dry stage: Secondary | ICD-10-CM | POA: Diagnosis not present

## 2017-12-10 DIAGNOSIS — H40053 Ocular hypertension, bilateral: Secondary | ICD-10-CM | POA: Diagnosis not present

## 2017-12-30 DIAGNOSIS — L03116 Cellulitis of left lower limb: Secondary | ICD-10-CM | POA: Diagnosis not present

## 2018-02-20 DIAGNOSIS — Z23 Encounter for immunization: Secondary | ICD-10-CM | POA: Diagnosis not present

## 2018-04-01 DIAGNOSIS — Z0000 Encounter for general adult medical examination without abnormal findings: Secondary | ICD-10-CM | POA: Diagnosis not present

## 2018-04-01 DIAGNOSIS — Z6839 Body mass index (BMI) 39.0-39.9, adult: Secondary | ICD-10-CM | POA: Diagnosis not present

## 2018-04-01 DIAGNOSIS — E785 Hyperlipidemia, unspecified: Secondary | ICD-10-CM | POA: Diagnosis not present

## 2018-04-01 DIAGNOSIS — K229 Disease of esophagus, unspecified: Secondary | ICD-10-CM | POA: Diagnosis not present

## 2018-04-01 DIAGNOSIS — I10 Essential (primary) hypertension: Secondary | ICD-10-CM | POA: Diagnosis not present

## 2018-05-22 ENCOUNTER — Encounter (INDEPENDENT_AMBULATORY_CARE_PROVIDER_SITE_OTHER): Payer: Medicare Other | Admitting: Ophthalmology

## 2018-05-22 DIAGNOSIS — I10 Essential (primary) hypertension: Secondary | ICD-10-CM | POA: Diagnosis not present

## 2018-05-22 DIAGNOSIS — H35033 Hypertensive retinopathy, bilateral: Secondary | ICD-10-CM

## 2018-05-22 DIAGNOSIS — H43813 Vitreous degeneration, bilateral: Secondary | ICD-10-CM | POA: Diagnosis not present

## 2018-05-22 DIAGNOSIS — H353134 Nonexudative age-related macular degeneration, bilateral, advanced atrophic with subfoveal involvement: Secondary | ICD-10-CM | POA: Diagnosis not present

## 2018-07-28 DIAGNOSIS — L304 Erythema intertrigo: Secondary | ICD-10-CM | POA: Diagnosis not present

## 2018-07-28 DIAGNOSIS — D225 Melanocytic nevi of trunk: Secondary | ICD-10-CM | POA: Diagnosis not present

## 2018-07-28 DIAGNOSIS — L570 Actinic keratosis: Secondary | ICD-10-CM | POA: Diagnosis not present

## 2018-07-28 DIAGNOSIS — Z1283 Encounter for screening for malignant neoplasm of skin: Secondary | ICD-10-CM | POA: Diagnosis not present

## 2018-07-28 DIAGNOSIS — X32XXXD Exposure to sunlight, subsequent encounter: Secondary | ICD-10-CM | POA: Diagnosis not present

## 2018-07-28 DIAGNOSIS — L218 Other seborrheic dermatitis: Secondary | ICD-10-CM | POA: Diagnosis not present

## 2018-12-18 DIAGNOSIS — Z961 Presence of intraocular lens: Secondary | ICD-10-CM | POA: Diagnosis not present

## 2018-12-18 DIAGNOSIS — H353132 Nonexudative age-related macular degeneration, bilateral, intermediate dry stage: Secondary | ICD-10-CM | POA: Diagnosis not present

## 2018-12-18 DIAGNOSIS — H40053 Ocular hypertension, bilateral: Secondary | ICD-10-CM | POA: Diagnosis not present

## 2018-12-18 DIAGNOSIS — H04123 Dry eye syndrome of bilateral lacrimal glands: Secondary | ICD-10-CM | POA: Diagnosis not present

## 2019-03-05 DIAGNOSIS — Z23 Encounter for immunization: Secondary | ICD-10-CM | POA: Diagnosis not present

## 2019-04-23 DIAGNOSIS — I10 Essential (primary) hypertension: Secondary | ICD-10-CM | POA: Diagnosis not present

## 2019-04-23 DIAGNOSIS — E785 Hyperlipidemia, unspecified: Secondary | ICD-10-CM | POA: Diagnosis not present

## 2019-04-23 DIAGNOSIS — M722 Plantar fascial fibromatosis: Secondary | ICD-10-CM | POA: Diagnosis not present

## 2019-05-20 ENCOUNTER — Encounter (INDEPENDENT_AMBULATORY_CARE_PROVIDER_SITE_OTHER): Payer: Medicare Other | Admitting: Ophthalmology

## 2019-05-21 ENCOUNTER — Encounter (INDEPENDENT_AMBULATORY_CARE_PROVIDER_SITE_OTHER): Payer: Medicare Other | Admitting: Ophthalmology

## 2019-05-21 ENCOUNTER — Other Ambulatory Visit: Payer: Self-pay

## 2019-05-21 DIAGNOSIS — H35033 Hypertensive retinopathy, bilateral: Secondary | ICD-10-CM

## 2019-05-21 DIAGNOSIS — H353134 Nonexudative age-related macular degeneration, bilateral, advanced atrophic with subfoveal involvement: Secondary | ICD-10-CM | POA: Diagnosis not present

## 2019-05-21 DIAGNOSIS — I10 Essential (primary) hypertension: Secondary | ICD-10-CM | POA: Diagnosis not present

## 2019-05-21 DIAGNOSIS — H43813 Vitreous degeneration, bilateral: Secondary | ICD-10-CM

## 2019-05-27 LAB — COMPREHENSIVE METABOLIC PANEL
ALT: 16 (ref 3–30)
AST: 19
Albumin/Globulin Ratio: 1.4
Albumin: 3.8
Alkaline Phosphatase, S: 90
BUN/Creatinine Ratio: 21
BUN: 20 (ref 4–21)
Calcium: 8.8
Carbon Dioxide, Total: 26
Chloride: 101
Creatine, Serum: 0.97
EGFR (Non-African Amer.): 75
Globulin, Total: 2.7
Glucose: 89
Potassium: 4.5
Sodium: 140
Total Bilirubin: 0.7
Total Protein: 6.5 g/dL

## 2019-05-27 LAB — LIPID PANEL (REFL)
Cholesterol, Total: 203
HDL Cholesterol: 62 (ref 35–70)
LDL Cholesterol (Calc): 121
Non-HDL Cholesterol: 141
Total CHOL/HDL Ratio: 3.3
Triglycerides: 113 (ref 40–160)
VLDL Cholesterol (Calc) NR: 20

## 2019-06-02 ENCOUNTER — Telehealth: Payer: Self-pay

## 2019-06-02 NOTE — Telephone Encounter (Signed)

## 2019-06-03 ENCOUNTER — Other Ambulatory Visit: Payer: Self-pay

## 2019-06-03 ENCOUNTER — Ambulatory Visit (INDEPENDENT_AMBULATORY_CARE_PROVIDER_SITE_OTHER): Payer: Medicare Other | Admitting: Physician Assistant

## 2019-06-03 VITALS — BP 165/90 | HR 72 | Temp 97.3°F | Resp 17 | Ht 69.0 in | Wt 274.0 lb

## 2019-06-03 DIAGNOSIS — M722 Plantar fascial fibromatosis: Secondary | ICD-10-CM | POA: Diagnosis not present

## 2019-06-03 DIAGNOSIS — M79604 Pain in right leg: Secondary | ICD-10-CM | POA: Diagnosis not present

## 2019-06-03 DIAGNOSIS — I10 Essential (primary) hypertension: Secondary | ICD-10-CM

## 2019-06-03 DIAGNOSIS — E785 Hyperlipidemia, unspecified: Secondary | ICD-10-CM | POA: Diagnosis not present

## 2019-06-03 MED ORDER — VALSARTAN-HYDROCHLOROTHIAZIDE 80-12.5 MG PO TABS: 1.0000 | ORAL_TABLET | Freq: Every day | ORAL | 2 refills | Status: DC

## 2019-06-03 NOTE — Progress Notes (Signed)
[name], is a 78 y.o. male  [id number]  [id number]  DOB - 03-06-1941  Subjective:  Chief Complaint and HPI: Ronald [name] is a 78 y.o. male here today to establish care.  Previously cared for at [facility].  Doesn't want to come in more than once a year.  He checks his BP at home and gets readings in 120s/low 80s.  He says he has a h/o white coat htn.  Compliant with meds.  No h/o cardiac events.    He brings in blood work from about a month ago(scanned into chart).  CMP unremarkable.  Lipids with total of 203 with mildly elevated LDL and good HDL numbers.  He has never had MI.  Also concerned about plantar fascitis.  Naprosyn 440 bid helps.    Also has R thigh pain and numbness in the front.  Denies back pain.  Says leg hurts so much he can't exercise.  He says he fell down some steps several years ago and has had pain and problems since.  He says he had a "hairline" fracture but when further questioned, he did not have xrays and this was self-diagnosed.  He feels something never healed right.    ROS:   Constitutional:  No f/c, No night sweats, No unexplained weight loss. EENT:  No vision changes, No blurry vision, No hearing changes. No mouth, throat, or ear problems.  Respiratory: No cough, No SOB Cardiac: No CP, no palpitations GI:  No abd pain, No N/V/D. GU: No Urinary s/sx Musculoskeletal: R leg pain and R foot apin Neuro: No headache, no dizziness, no motor weakness.  Skin: No rash Endocrine:  No polydipsia. No polyuria.  Psych: Denies SI/HI  No problems updated.  ALLERGIES: No Known Allergies  PAST MEDICAL HISTORY: No past medical history on file.  MEDICATIONS AT HOME: Prior to Admission medications   Medication Sig Start Date End Date Taking? Authorizing Provider  aspirin (ASPIRIN 81) 81 MG chewable tablet Chew 81 mg by mouth daily.   Yes [provider]  brimonidine-timolol (COMBIGAN) 0.2-0.5 % ophthalmic solution Place 1 drop  into both eyes every 12 (twelve) hours.   Yes [provider]  Multiple Vitamin (MULTIVITAMIN) tablet Take 1 tablet by mouth daily.   Yes [provider]  Multiple Vitamins-Minerals (PRESERVISION AREDS 2) CAPS Take 1 capsule by mouth daily.   Yes [provider]  Omega-3 1000 MG CAPS Take 1 capsule by mouth daily.   Yes [provider]  valsartan-hydrochlorothiazide (DIOVAN-HCT) 80-12.5 MG tablet Take 1 tablet by mouth daily. 06/03/19  Yes Argentina Donovan, PA-C     Objective:  EXAM:   Vitals:   06/03/19 1440  BP: (!) 165/90  Pulse: 72  Resp: 17  Temp: (!) 97.3 F (36.3 C)  TempSrc: Temporal  SpO2: 97%  Weight: 274 lb (124.3 kg)  Height: 5\' 9"  (1.753 m)    General appearance : A&OX3. NAD. Non-toxic-appearing HEENT: Atraumatic and Normocephalic.  PERRLA. EOM intact.  TNeck: supple, no JVD. No cervical lymphadenopathy. No thyromegaly Chest/Lungs:  Breathing-non-labored, Good air entry bilaterally, breath sounds normal without rales, rhonchi, or wheezing  CVS: S1 S2 regular, no murmurs, gallops, rubs  Extremities: Bilateral Lower Ext shows no edema, both legs are warm to touch with = pulse throughout Neurology:  CN II-XII grossly intact, Non focal.   Psych:  TP linear. J/I WNL. Normal speech. Appropriate eye contact and affect.  Skin:  No Rash  Data Review No results found for: HGBA1C  Assessment & Plan   1. Hypertension, unspecified type Controlled based on home readings-patient has h/o white coat htn. Reviewed labs - valsartan-hydrochlorothiazide (DIOVAN-HCT) 80-12.5 MG tablet; Take 1 tablet by mouth daily.  Dispense: 90 tablet; Refill: 2  2. Hyperlipidemia, unspecified hyperlipidemia type Reviewed labs.  Statins not indicated given total of 2013 and good HDL and age. Scan labs to chart  3.  R leg pain -refer ortho  4. Plantar fascitis -night splints and stretching.  Ice massage  I spent >40 mins face to face answering  questions and concerns.  Patient does not want to get PSA.    Patient have been counseled extensively about nutrition and exercise  Return in about 9 months (around 03/03/2020) for assign PCP and bloodwork.  The patient was given clear instructions to go to ER or return to medical center if symptoms don't improve, worsen or new problems develop. The patient verbalized understanding. The patient was told to call to get lab results if they haven't heard anything in the next week.     Freeman Caldron, PA-C [facility] and [facility], [place]   06/03/2019, 2:53 PMPatient ID: Ronald [name], male   DOB: 1940/12/10, 78 y.o.   MRN: [id number]

## 2019-06-08 ENCOUNTER — Other Ambulatory Visit: Payer: Self-pay

## 2019-06-08 ENCOUNTER — Ambulatory Visit (INDEPENDENT_AMBULATORY_CARE_PROVIDER_SITE_OTHER): Payer: Medicare Other

## 2019-06-08 ENCOUNTER — Ambulatory Visit (INDEPENDENT_AMBULATORY_CARE_PROVIDER_SITE_OTHER): Payer: Medicare Other | Admitting: Orthopaedic Surgery

## 2019-06-08 ENCOUNTER — Ambulatory Visit: Payer: Medicare Other

## 2019-06-08 ENCOUNTER — Encounter: Payer: Self-pay | Admitting: Orthopaedic Surgery

## 2019-06-08 DIAGNOSIS — M79604 Pain in right leg: Secondary | ICD-10-CM | POA: Diagnosis not present

## 2019-06-08 MED ORDER — GABAPENTIN 300 MG PO CAPS: 300.0000 mg | ORAL_CAPSULE | Freq: Every day | ORAL | 1 refills | Status: DC

## 2019-06-08 NOTE — Progress Notes (Signed)
Office Visit Note   Patient: Ronald [name]           Date of Birth: [birth year]           MRN: [id number] Visit Date: 06/08/2019              Requested by: Argentina Donovan, PA-C [place],  [place] [address] PCP: London Pepper, MD   Assessment & Plan: Visit Diagnoses:  1. Pain in right leg     Plan: I did give him reassurance that I do not see anything broken or any evidence of a previous fracture of his tibia.  I think the naproxen is good to try but he should also consider Voltaren gel on the bottom of his foot as well as the shin area.  I would also like to put him on gabapentin 300 mg to take at night which can help with nerve pathways.  I would like to see how he does with this medication regimen before exploring other possibilities such as an MRI of his lumbar spine.  He agrees with this treatment plan.  I showed him stretching exercises to try for his plantar fasciitis as well.  All question concerns were answered and addressed.  We will see him back in 4 weeks to see how he is doing overall.  Follow-Up Instructions: Return in about 4 weeks (around 07/06/2019).   Orders:  Orders Placed This Encounter  Procedures  . XR Tibia/Fibula Right   Meds ordered this encounter  Medications  . gabapentin (NEURONTIN) 300 MG capsule    Sig: Take 1 capsule (300 mg total) by mouth at bedtime.    Dispense:  30 capsule    Refill:  1      Procedures: No procedures performed   Clinical Data: No additional findings.   Subjective: Chief Complaint  Patient presents with  . Right Leg - Pain  The patient is something for the first time.  He has been dealing with right leg pain since he slipped on some wet stairs about 3 years ago and injured the anterior part of his shin which he says was about 12 cm or less up from the ankle joint.  He says it is getting worse as far as range of motion and strength he said he cannot walk long and is developed plantar fasciitis in the  bottom of his right foot.  He does report some weakness.  He has had some thigh pain and numbness as well.  He denies any low back pain.  Denies any change in bowel bladder function.  He is not a diabetic.  He says naproxen has helped much better than meloxicam in terms of his symptoms.  HPI  Review of Systems He currently denies any headache, chest pain, shortness of breath, fever, chills, nausea, vomiting  Objective: Vital Signs: There were no vitals taken for this visit.  Physical Exam He is alert and orient x3 and in no acute distress Ortho Exam examination of his right shin shows no gross findings on inspection.  There is no significant issues with his knee or ankle and he has good strength in his lower extremity.  His back and hip exam showed no acute findings other than some stiffness in the lumbar spine.  He has got good mobility of his foot and ankle.  He is slow to mobilize in general when he first gets up. Specialty Comments:  No specialty comments available.  Imaging: XR Tibia/Fibula Right  Result Date: 06/08/2019 2 views of the right tibia and fibula show no acute findings.  There is no evidence of a remote fracture of the tibial shaft.    PMFS History: There are no problems to display for this patient.  History reviewed. No pertinent past medical history.  History reviewed. No pertinent family history.  History reviewed. No pertinent surgical history. Social History   Occupational History  . Not on file  Tobacco Use  . Smoking status: Not on file  Substance and Sexual Activity  . Alcohol use: Not on file  . Drug use: Not on file  . Sexual activity: Not on file

## 2019-06-10 ENCOUNTER — Encounter: Payer: Self-pay | Admitting: Physician Assistant

## 2019-06-12 ENCOUNTER — Encounter: Payer: Self-pay | Admitting: Physician Assistant

## 2019-06-16 ENCOUNTER — Ambulatory Visit: Payer: Medicare Other | Attending: Internal Medicine

## 2019-06-16 DIAGNOSIS — Z23 Encounter for immunization: Secondary | ICD-10-CM | POA: Diagnosis not present

## 2019-06-16 NOTE — Progress Notes (Signed)
   Covid-19 Vaccination Clinic  Name:  Korver Galdi    MRN: [id number] DOB: Apr 12, 1941  06/16/2019  Mr. Igleheart was observed post Covid-19 immunization for 15 minutes without incidence. He was provided with Vaccine Information Sheet and instruction to access the V-Safe system.   Mr. Bunting was instructed to call 911 with any severe reactions post vaccine: Marland Kitchen Difficulty breathing  . Swelling of your face and throat  . A fast heartbeat  . A bad rash all over your body  . Dizziness and weakness    Immunizations Administered    Name Date Dose VIS Date Route   Pfizer COVID-19 Vaccine 06/16/2019 12:34 PM 0.3 mL 05/15/2019 Intramuscular   Manufacturer: [place]   Lot: [id number]   [place]: [id number]

## 2019-06-17 ENCOUNTER — Encounter: Payer: Self-pay | Admitting: Physician Assistant

## 2019-07-06 ENCOUNTER — Ambulatory Visit: Payer: Medicare Other | Attending: Internal Medicine

## 2019-07-06 ENCOUNTER — Ambulatory Visit: Payer: Medicare Other

## 2019-07-06 DIAGNOSIS — Z23 Encounter for immunization: Secondary | ICD-10-CM | POA: Insufficient documentation

## 2019-07-06 NOTE — Progress Notes (Signed)
   Covid-19 Vaccination Clinic  Name:  Demar Fritchie    MRN: [id number] DOB: 1941-05-15  07/06/2019  Mr. Shamrock was observed post Covid-19 immunization for 15 minutes without incidence. He was provided with Vaccine Information Sheet and instruction to access the V-Safe system.   Mr. Rossen was instructed to call 911 with any severe reactions post vaccine: Marland Kitchen Difficulty breathing  . Swelling of your face and throat  . A fast heartbeat  . A bad rash all over your body  . Dizziness and weakness    Immunizations Administered    Name Date Dose VIS Date Route   Pfizer COVID-19 Vaccine 07/06/2019 10:26 AM 0.3 mL 05/15/2019 Intramuscular   Manufacturer: [place]   Lot: [id number]   [place]: [place] COVID-19 Vaccine 07/06/2019 10:30 AM 0.3 mL 05/15/2019 Intramuscular   Manufacturer: [place]   Lot: [id number]   [place]: [id number]

## 2019-07-09 ENCOUNTER — Other Ambulatory Visit: Payer: Self-pay

## 2019-07-09 ENCOUNTER — Encounter: Payer: Self-pay | Admitting: Orthopaedic Surgery

## 2019-07-09 ENCOUNTER — Ambulatory Visit (INDEPENDENT_AMBULATORY_CARE_PROVIDER_SITE_OTHER): Payer: Medicare Other | Admitting: Orthopaedic Surgery

## 2019-07-09 DIAGNOSIS — M79604 Pain in right leg: Secondary | ICD-10-CM | POA: Diagnosis not present

## 2019-07-09 NOTE — Progress Notes (Signed)
Office Visit Note   Patient: Ronald [name]           Date of Birth: 06-05-1940           MRN: [id number] Visit Date: 07/09/2019              Requested by: London Pepper, MD [address],  [place] [address] PCP: London Pepper, MD   Assessment & Plan: Visit Diagnoses:  1. Pain in right leg     Plan: We will obtain a Doppler of his right leg to rule out DVT.  Did discuss with him compression socks or compression garments he feels these to be very difficult to don.  If the ultrasound is negative for DVT the right leg may consider work-up of his lumbar spine as a source right lower leg pain.  Questions were encouraged and answered by Dr. Ninfa Linden and myself.  Follow-Up Instructions: Return if symptoms worsen or fail to improve.   Orders:  No orders of the defined types were placed in this encounter.  No orders of the defined types were placed in this encounter.     Procedures: No procedures performed   Clinical Data: No additional findings.   Subjective: Chief Complaint  Patient presents with  . Right Leg - Follow-up    HPI Mr. Degenova returns today for follow-up of his right tib-fib pain.  He states the plantar fasciitis is better but his leg still is painful.  He states that his leg feels like it is "leg ankle "whenever he is standing for long period of time or walks for prolonged period of time.  The sensation of the leg is painful but he is unable to describe it beyond that.  The pain extends from just below the knee down to the ankle.  He denies any back pain.  Does note that his wife is concerned about the swelling in both of his legs.  He is having no shortness of breath chest pain.  Review of Systems  Constitutional: Negative for chills and fever.  Musculoskeletal: Positive for arthralgias. Negative for back pain.  Neurological: Positive for numbness.     Objective: Vital Signs: There were no vitals taken for this visit.  Physical  Exam Constitutional:      Appearance: He is not ill-appearing.  Cardiovascular:     Pulses: Normal pulses.  Pulmonary:     Effort: Pulmonary effort is normal.  Neurological:     Mental Status: He is alert and oriented to person, place, and time.  Psychiatric:        Mood and Affect: Mood normal.     Ortho Exam Negative straight leg raise bilaterally.  Positive and tight hamstrings bilaterally.  5 out of 5 strength throughout lower extremities against resistance.  Subjective decreased sensation throughout the right foot compared to left except for the plantar aspect which is equal bilaterally to light touch.  Lower leg edema bilaterally with right calf tenderness with palpation.  No rashes skin lesions ulcerations bilateral lower extremities.  Lumbar spine nontender over the spinal column with palpation.  No tenderness over the lumbar spine paraspinous region on the left than the right.  Specialty Comments:  No specialty comments available.  Imaging: No results found.   PMFS History: There are no problems to display for this patient.  No past medical history on file.  No family history on file.  No past surgical history on file. Social History   Occupational History  .  Not on file  Tobacco Use  . Smoking status: Not on file  Substance and Sexual Activity  . Alcohol use: Not on file  . Drug use: Not on file  . Sexual activity: Not on file

## 2019-07-10 ENCOUNTER — Telehealth: Payer: Self-pay | Admitting: *Deleted

## 2019-07-10 ENCOUNTER — Encounter: Payer: Self-pay | Admitting: *Deleted

## 2019-07-10 ENCOUNTER — Ambulatory Visit (HOSPITAL_COMMUNITY): Payer: Medicare Other

## 2019-07-10 NOTE — Telephone Encounter (Signed)
Pt is scheduled today at [facility] at 2:00pm with 1:45pm arrival. Pt is to enter from Entrance C of the parking lot from [facility] for pt to rc my call.

## 2019-07-13 DIAGNOSIS — D225 Melanocytic nevi of trunk: Secondary | ICD-10-CM | POA: Diagnosis not present

## 2019-07-13 DIAGNOSIS — L570 Actinic keratosis: Secondary | ICD-10-CM | POA: Diagnosis not present

## 2019-07-13 DIAGNOSIS — Z1283 Encounter for screening for malignant neoplasm of skin: Secondary | ICD-10-CM | POA: Diagnosis not present

## 2019-07-13 DIAGNOSIS — X32XXXD Exposure to sunlight, subsequent encounter: Secondary | ICD-10-CM | POA: Diagnosis not present

## 2019-07-14 ENCOUNTER — Ambulatory Visit (HOSPITAL_COMMUNITY)
Admission: RE | Admit: 2019-07-14 | Discharge: 2019-07-14 | Disposition: A | Discharge status: Home / Self Care | Payer: Medicare Other | Source: Ambulatory Visit | Attending: Orthopaedic Surgery | Admitting: Orthopaedic Surgery

## 2019-07-14 ENCOUNTER — Other Ambulatory Visit: Payer: Self-pay

## 2019-07-14 DIAGNOSIS — M79604 Pain in right leg: Secondary | ICD-10-CM

## 2019-07-14 NOTE — Progress Notes (Signed)
Right lower extremity venous duplex has been completed. Preliminary results can be found in CV Proc through chart review.  Results were given to Dr. Ninfa Linden.  07/14/19 4:33 PM Ronald [name] RVT

## 2019-07-30 ENCOUNTER — Other Ambulatory Visit: Payer: Self-pay | Admitting: Orthopaedic Surgery

## 2019-07-31 NOTE — Telephone Encounter (Signed)
Please advise 

## 2019-08-10 ENCOUNTER — Ambulatory Visit: Payer: Medicare Other

## 2019-09-18 ENCOUNTER — Other Ambulatory Visit: Payer: Self-pay | Admitting: Orthopaedic Surgery

## 2019-09-21 NOTE — Telephone Encounter (Signed)
Please advise 

## 2019-12-08 DIAGNOSIS — Z1159 Encounter for screening for other viral diseases: Secondary | ICD-10-CM | POA: Diagnosis not present

## 2019-12-10 ENCOUNTER — Encounter (INDEPENDENT_AMBULATORY_CARE_PROVIDER_SITE_OTHER): Payer: Medicare Other | Admitting: Internal Medicine

## 2019-12-10 DIAGNOSIS — K3189 Other diseases of stomach and duodenum: Secondary | ICD-10-CM | POA: Diagnosis not present

## 2019-12-10 LAB — HM COLONOSCOPY

## 2019-12-21 ENCOUNTER — Telehealth: Payer: Self-pay

## 2019-12-21 NOTE — Telephone Encounter (Signed)

## 2019-12-21 NOTE — Patient Instructions (Signed)
Thank you for choosing Primary Care at [facility] to be your medical home!    Jehiel Koepp was seen by Melina Schools, DO today.   Juanetta Snow primary care provider is Phill Myron, DO.   For the best care possible, you should try to see Phill Myron, DO whenever you come to the clinic.   We look forward to seeing you again soon!  If you have any questions about your visit today, please call us at [phone] or feel free to reach your primary care provider via [place].

## 2019-12-22 ENCOUNTER — Other Ambulatory Visit: Payer: Self-pay

## 2019-12-22 ENCOUNTER — Ambulatory Visit (INDEPENDENT_AMBULATORY_CARE_PROVIDER_SITE_OTHER): Payer: Medicare Other | Admitting: Internal Medicine

## 2019-12-22 ENCOUNTER — Encounter: Payer: Self-pay | Admitting: Internal Medicine

## 2019-12-22 VITALS — BP 156/88 | HR 66 | Temp 97.3°F | Resp 17 | Ht 68.0 in | Wt 279.0 lb

## 2019-12-22 DIAGNOSIS — R200 Anesthesia of skin: Secondary | ICD-10-CM

## 2019-12-22 DIAGNOSIS — M79676 Pain in unspecified toe(s): Secondary | ICD-10-CM | POA: Diagnosis not present

## 2019-12-22 DIAGNOSIS — I10 Essential (primary) hypertension: Secondary | ICD-10-CM

## 2019-12-22 DIAGNOSIS — Z1159 Encounter for screening for other viral diseases: Secondary | ICD-10-CM | POA: Diagnosis not present

## 2019-12-22 DIAGNOSIS — R32 Unspecified urinary incontinence: Secondary | ICD-10-CM | POA: Diagnosis not present

## 2019-12-22 DIAGNOSIS — N529 Male erectile dysfunction, unspecified: Secondary | ICD-10-CM | POA: Diagnosis not present

## 2019-12-22 DIAGNOSIS — E785 Hyperlipidemia, unspecified: Secondary | ICD-10-CM | POA: Diagnosis not present

## 2019-12-22 MED ORDER — VALSARTAN-HYDROCHLOROTHIAZIDE 80-12.5 MG PO TABS: 1.0000 | ORAL_TABLET | Freq: Every day | ORAL | 1 refills | Status: DC

## 2019-12-22 NOTE — Progress Notes (Signed)
Subjective:    Ronald [name] - 79 y.o. male MRN [id number]  Date of birth: 01-09-1941  HPI  Ronald [name] is here for follow up of chronic medical conditions.  He previously was monitoring his BP at home. "Got bored" with checking frequently since was always 120s/70s. Has a history of whitecoat HTN. Says thinks BP is higher today since I am a new provider to him. Needs refill of Valsartan-HCTZ. Denies chest pain, headaches, vision changes.   Was given Lamisil PO about ten years ago to treat toenail fungus. This seemed to help the fungus but since he has had toenail pain and has had to change what type of shoes he wears due to damage of the nail beds. Sometimes toenails separate from the skin.   Has concerns about urinary incontinence. Occurs when he avoids going to the bathroom due to focus on another task. Bladder will get very full, he will have urgent need to empty it, and will sometimes have excess urine dribble out.   Suffers from ED. Has tried several medications, none have seemed to work.   Has right sided thigh numbness and pain that radiates down leg. Has seen ortho for this. Was given Gabapentin. Doesn't seem to help. Has been occurring for many, many years. Did not seem to be an issue when he was golfing and lost a lot of weight.    Health Maintenance:  Health Maintenance Due  Topic Date Due  . Hepatitis C Screening  Never done    -  reports that he has quit smoking. He has never used smokeless tobacco. - Review of Systems: Per HPI. - Past Medical History: There are no problems to display for this patient.  - Medications: reviewed and updated   Objective:   Physical Exam BP (!) 156/88   Pulse 66   Temp (!) 97.3 F (36.3 C) (Temporal)   Resp 17   Ht 5\' 8"  (1.727 m)   Wt 279 lb (126.6 kg)   SpO2 95%   BMI 42.42 kg/m  Physical Exam Constitutional:      General: He is not in acute distress.    Appearance: He is not diaphoretic.  Cardiovascular:     Rate  and Rhythm: Normal rate.  Pulmonary:     Effort: Pulmonary effort is normal. No respiratory distress.  Musculoskeletal:        General: Normal range of motion.  Skin:    General: Skin is warm and dry.  Neurological:     Mental Status: He is alert and oriented to person, place, and time.  Psychiatric:        Mood and Affect: Affect normal.        Judgment: Judgment normal.            Assessment & Plan:   1. Essential hypertension BP above goal today. Patient reports suffers from white coat HTN and has been at goal at home and in the past with providers that he has grown to trust. No red flag symptoms. Continue current therapy. Will monitor labs with medications.  - CBC with Differential - Comprehensive metabolic panel - valsartan-hydrochlorothiazide (DIOVAN-HCT) 80-12.5 MG tablet; Take 1 tablet by mouth daily.  Dispense: 90 tablet; Refill: 1   2. Hyperlipidemia, unspecified hyperlipidemia type Discussed diet, weight loss, and exercise. Repeat lipid panel at next visit. Will plan to discuss that with age could defer statin but that his ASCVD risk score is elevated.   3. Need for hepatitis C screening test -  Hepatitis C Antibody  4. Erectile dysfunction, unspecified erectile dysfunction type Urology referral to discuss management options as has failed PO medications per patient. Weight loss would likely be beneficial as well.  - Ambulatory referral to Urology   5. Urinary incontinence, unspecified type Sounds consistent with overflow or urge incontinence. Will defer to urology for further work up/management as already sending for ED.  - Ambulatory referral to Urology  6. Pain around toenail - Ambulatory referral to Podiatry  7. Numbness of right anterior thigh Suspect related to nerve compression from abdominal circumference/weight as has had discontinuation of pain when within a lower BMI class. Discussed water exercises for low impact but high resistance exercise. Aim  for 150 minutes of elevated heart rate per week for weight loss.    Phill Myron, D.O. 12/22/2019, 2:32 PM Primary Care at [facility]

## 2019-12-23 LAB — CBC WITH DIFFERENTIAL/PLATELET
Basophils Absolute: 0 10*3/uL (ref 0.0–0.2)
Basos: 1 %
EOS (ABSOLUTE): 0.1 10*3/uL (ref 0.0–0.4)
Eos: 2 %
Hematocrit: 40.1 % (ref 37.5–51.0)
Hemoglobin: 13.8 g/dL (ref 13.0–17.7)
Immature Grans (Abs): 0 10*3/uL (ref 0.0–0.1)
Immature Granulocytes: 0 %
Lymphocytes Absolute: 2.3 10*3/uL (ref 0.7–3.1)
Lymphs: 42 %
MCH: 31.9 pg (ref 26.6–33.0)
MCHC: 34.4 g/dL (ref 31.5–35.7)
MCV: 93 fL (ref 79–97)
Monocytes Absolute: 0.6 10*3/uL (ref 0.1–0.9)
Monocytes: 10 %
Neutrophils Absolute: 2.5 10*3/uL (ref 1.4–7.0)
Neutrophils: 45 %
Platelets: 168 10*3/uL (ref 150–450)
RBC: 4.32 x10E6/uL (ref 4.14–5.80)
RDW: 13.1 % (ref 11.6–15.4)
WBC: 5.6 10*3/uL (ref 3.4–10.8)

## 2019-12-23 LAB — COMPREHENSIVE METABOLIC PANEL
ALT: 14 IU/L (ref 0–44)
AST: 15 IU/L (ref 0–40)
Albumin/Globulin Ratio: 1.5 (ref 1.2–2.2)
Albumin: 3.7 g/dL (ref 3.7–4.7)
Alkaline Phosphatase: 88 IU/L (ref 48–121)
BUN/Creatinine Ratio: 19 (ref 10–24)
BUN: 20 mg/dL (ref 8–27)
Bilirubin Total: 0.5 mg/dL (ref 0.0–1.2)
CO2: 25 mmol/L (ref 20–29)
Calcium: 8.9 mg/dL (ref 8.6–10.2)
Chloride: 103 mmol/L (ref 96–106)
Creatinine, Ser: 1.08 mg/dL (ref 0.76–1.27)
GFR calc Af Amer: 76 mL/min/{1.73_m2} (ref >59)
GFR calc non Af Amer: 66 mL/min/{1.73_m2} (ref >59)
Globulin, Total: 2.5 g/dL (ref 1.5–4.5)
Glucose: 92 mg/dL (ref 65–99)
Potassium: 4.8 mmol/L (ref 3.5–5.2)
Sodium: 142 mmol/L (ref 134–144)
Total Protein: 6.2 g/dL (ref 6.0–8.5)

## 2019-12-23 LAB — HEPATITIS C ANTIBODY: Hep C Virus Ab: <0.1 s/co ratio (ref 0.0–0.9)

## 2019-12-24 DIAGNOSIS — H04123 Dry eye syndrome of bilateral lacrimal glands: Secondary | ICD-10-CM | POA: Diagnosis not present

## 2019-12-24 DIAGNOSIS — H40053 Ocular hypertension, bilateral: Secondary | ICD-10-CM | POA: Diagnosis not present

## 2019-12-24 DIAGNOSIS — H353132 Nonexudative age-related macular degeneration, bilateral, intermediate dry stage: Secondary | ICD-10-CM | POA: Diagnosis not present

## 2019-12-24 DIAGNOSIS — Z961 Presence of intraocular lens: Secondary | ICD-10-CM | POA: Diagnosis not present

## 2019-12-25 NOTE — Progress Notes (Signed)
Patient cancelled appointment. Chart opened in error.

## 2020-02-10 DIAGNOSIS — H0102B Squamous blepharitis left eye, upper and lower eyelids: Secondary | ICD-10-CM | POA: Diagnosis not present

## 2020-02-10 DIAGNOSIS — H1045 Other chronic allergic conjunctivitis: Secondary | ICD-10-CM | POA: Diagnosis not present

## 2020-02-10 DIAGNOSIS — H04123 Dry eye syndrome of bilateral lacrimal glands: Secondary | ICD-10-CM | POA: Diagnosis not present

## 2020-02-10 DIAGNOSIS — H0102A Squamous blepharitis right eye, upper and lower eyelids: Secondary | ICD-10-CM | POA: Diagnosis not present

## 2020-02-25 DIAGNOSIS — Z23 Encounter for immunization: Secondary | ICD-10-CM | POA: Diagnosis not present

## 2020-02-29 DIAGNOSIS — Z23 Encounter for immunization: Secondary | ICD-10-CM | POA: Diagnosis not present

## 2020-05-18 ENCOUNTER — Other Ambulatory Visit: Payer: Self-pay

## 2020-05-18 ENCOUNTER — Encounter (INDEPENDENT_AMBULATORY_CARE_PROVIDER_SITE_OTHER): Payer: Medicare Other | Admitting: Ophthalmology

## 2020-05-18 DIAGNOSIS — H43813 Vitreous degeneration, bilateral: Secondary | ICD-10-CM | POA: Diagnosis not present

## 2020-05-18 DIAGNOSIS — H35033 Hypertensive retinopathy, bilateral: Secondary | ICD-10-CM

## 2020-05-18 DIAGNOSIS — H353134 Nonexudative age-related macular degeneration, bilateral, advanced atrophic with subfoveal involvement: Secondary | ICD-10-CM

## 2020-05-18 DIAGNOSIS — I10 Essential (primary) hypertension: Secondary | ICD-10-CM | POA: Diagnosis not present

## 2020-06-24 DIAGNOSIS — E669 Obesity, unspecified: Secondary | ICD-10-CM

## 2020-06-24 HISTORY — DX: Obesity, unspecified: E66.9

## 2020-07-18 DIAGNOSIS — I872 Venous insufficiency (chronic) (peripheral): Secondary | ICD-10-CM | POA: Diagnosis not present

## 2020-07-18 DIAGNOSIS — D225 Melanocytic nevi of trunk: Secondary | ICD-10-CM | POA: Diagnosis not present

## 2020-07-18 DIAGNOSIS — L304 Erythema intertrigo: Secondary | ICD-10-CM | POA: Diagnosis not present

## 2020-07-18 DIAGNOSIS — Z1283 Encounter for screening for malignant neoplasm of skin: Secondary | ICD-10-CM | POA: Diagnosis not present

## 2020-08-17 NOTE — Telephone Encounter (Signed)
Covid booster vaccine updated

## 2020-09-06 ENCOUNTER — Other Ambulatory Visit: Payer: Self-pay

## 2020-09-06 ENCOUNTER — Encounter: Payer: Self-pay | Admitting: Family

## 2020-09-06 ENCOUNTER — Ambulatory Visit (INDEPENDENT_AMBULATORY_CARE_PROVIDER_SITE_OTHER): Payer: Medicare Other | Admitting: Family

## 2020-09-06 VITALS — BP 131/98 | HR 100 | Ht 67.99 in | Wt 291.2 lb

## 2020-09-06 DIAGNOSIS — R0609 Other forms of dyspnea: Secondary | ICD-10-CM

## 2020-09-06 DIAGNOSIS — R600 Localized edema: Secondary | ICD-10-CM

## 2020-09-06 DIAGNOSIS — H6982 Other specified disorders of Eustachian tube, left ear: Secondary | ICD-10-CM

## 2020-09-06 DIAGNOSIS — R0600 Dyspnea, unspecified: Secondary | ICD-10-CM

## 2020-09-06 NOTE — Patient Instructions (Addendum)
   Referral to Cardiology.  Referral to ENT.  Labs today.   Follow-up with primary provider as scheduled.  Shortness of Breath, Adult Shortness of breath means you have trouble breathing. Shortness of breath could be a sign of a medical problem. Follow these instructions at home:  Watch for any changes in your symptoms.  Do not use any products that contain nicotine or tobacco, such as cigarettes, e-cigarettes, and chewing tobacco.  Do not smoke. Smoking can cause shortness of breath. If you need help to quit smoking, ask your doctor.  Avoid things that can make it harder to breathe, such as: ? Mold. ? Dust. ? Air pollution. ? Chemical smells. ? Things that can cause allergy symptoms (allergens), if you have allergies.  Keep your living space clean. Use products that help remove mold and dust.  Rest as needed. Slowly return to your normal activities.  Take over-the-counter and prescription medicines only as told by your doctor. This includes oxygen therapy and inhaled medicines.  Keep all follow-up visits as told by your doctor. This is important.   Contact a doctor if:  Your condition does not get better as soon as expected.  You have a hard time doing your normal activities, even after you rest.  You have new symptoms. Get help right away if:  Your shortness of breath gets worse.  You have trouble breathing when you are resting.  You feel light-headed or you pass out (faint).  You have a cough that is not helped by medicines.  You cough up blood.  You have pain with breathing.  You have pain in your chest, arms, shoulders, or belly (abdomen).  You have a fever.  You cannot walk up stairs.  You cannot exercise the way you normally do. These symptoms may represent a serious problem that is an emergency. Do not wait to see if the symptoms will go away. Get medical help right away. Call your local emergency services (911 in the [place] Do not drive yourself to  the hospital. Summary  Shortness of breath is when you have trouble breathing enough air. It can be a sign of a medical problem.  Avoid things that make it hard for you to breathe, such as smoking, pollution, mold, and dust.  Watch for any changes in your symptoms. Contact your doctor if you do not get better or you get worse. This information is not intended to replace advice given to you by your health care provider. Make sure you discuss any questions you have with your health care provider. Document Revised: 10/21/2017 Document Reviewed: 10/21/2017 Elsevier Patient Education  2021 [employer].
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
## 2020-09-06 NOTE — Progress Notes (Signed)
Fatigue 6-8wks  SOB while lying down Swelling in ankles, feet, irregular heartbeat,congestion

## 2020-09-07 ENCOUNTER — Encounter: Payer: Self-pay | Admitting: Family

## 2020-09-08 LAB — COMPREHENSIVE METABOLIC PANEL
ALT: 59 IU/L — ABNORMAL HIGH (ref 0–44)
AST: 50 IU/L — ABNORMAL HIGH (ref 0–40)
Albumin/Globulin Ratio: 1.7 (ref 1.2–2.2)
Albumin: 3.9 g/dL (ref 3.7–4.7)
Alkaline Phosphatase: 100 IU/L (ref 44–121)
BUN/Creatinine Ratio: 23 (ref 10–24)
BUN: 22 mg/dL (ref 8–27)
Bilirubin Total: 0.9 mg/dL (ref 0.0–1.2)
CO2: 25 mmol/L (ref 20–29)
Calcium: 9 mg/dL (ref 8.6–10.2)
Chloride: 104 mmol/L (ref 96–106)
Creatinine, Ser: 0.96 mg/dL (ref 0.76–1.27)
Globulin, Total: 2.3 g/dL (ref 1.5–4.5)
Glucose: 97 mg/dL (ref 65–99)
Potassium: 4.2 mmol/L (ref 3.5–5.2)
Sodium: 143 mmol/L (ref 134–144)
Total Protein: 6.2 g/dL (ref 6.0–8.5)
eGFR: 81 mL/min/{1.73_m2} (ref >59)

## 2020-09-08 LAB — CBC
Hematocrit: 43.1 % (ref 37.5–51.0)
Hemoglobin: 14.6 g/dL (ref 13.0–17.7)
MCH: 31.3 pg (ref 26.6–33.0)
MCHC: 33.9 g/dL (ref 31.5–35.7)
MCV: 92 fL (ref 79–97)
Platelets: 175 10*3/uL (ref 150–450)
RBC: 4.67 x10E6/uL (ref 4.14–5.80)
RDW: 14.2 % (ref 11.6–15.4)
WBC: 7.2 10*3/uL (ref 3.4–10.8)

## 2020-09-08 LAB — D-DIMER, QUANTITATIVE

## 2020-09-08 LAB — SPECIMEN STATUS REPORT

## 2020-09-08 LAB — BRAIN NATRIURETIC PEPTIDE

## 2020-09-08 MED ORDER — FUROSEMIDE 20 MG PO TABS: 20.0000 mg | ORAL_TABLET | Freq: Every day | ORAL | 0 refills | Status: DC

## 2020-09-08 NOTE — Progress Notes (Signed)
Kidney function normal.   No anemia.  Begin low-dose Lasix for bilateral leg swelling. Keep Cardiology referral appointment as discussed in office. Report to Emergency Department or return to medical center if symptoms worsen or new problems develop.   Liver function higher than normal. Limit use of NSAID's such as Ibuprofen, Aleve, and Naproxen. Limit use of Tylenol. Encouraged to have rechecked in 6 weeks.  BNP and D-dimer pending.

## 2020-09-08 NOTE — Addendum Note (Signed)
Addended by: Camillia Herter on: 09/08/2020 10:52 PM   Modules accepted: Orders

## 2020-09-12 ENCOUNTER — Other Ambulatory Visit: Payer: Self-pay

## 2020-09-14 ENCOUNTER — Emergency Department (HOSPITAL_BASED_OUTPATIENT_CLINIC_OR_DEPARTMENT_OTHER): Payer: Medicare Other

## 2020-09-14 ENCOUNTER — Inpatient Hospital Stay (HOSPITAL_BASED_OUTPATIENT_CLINIC_OR_DEPARTMENT_OTHER)
Admission: EM | Admit: 2020-09-14 | Discharge: 2020-09-19 | DRG: 308 | Disposition: A | Discharge status: Home / Self Care | Payer: Medicare Other | Attending: Internal Medicine | Admitting: Internal Medicine

## 2020-09-14 ENCOUNTER — Ambulatory Visit (INDEPENDENT_AMBULATORY_CARE_PROVIDER_SITE_OTHER): Payer: Medicare Other | Admitting: Cardiology

## 2020-09-14 ENCOUNTER — Encounter (HOSPITAL_BASED_OUTPATIENT_CLINIC_OR_DEPARTMENT_OTHER): Payer: Self-pay | Admitting: *Deleted

## 2020-09-14 ENCOUNTER — Inpatient Hospital Stay
Admission: AD | Admit: 2020-09-14 | Payer: Medicare Other | Source: Other Acute Inpatient Hospital | Admitting: Cardiovascular Disease

## 2020-09-14 ENCOUNTER — Encounter: Payer: Self-pay | Admitting: Cardiology

## 2020-09-14 ENCOUNTER — Other Ambulatory Visit: Payer: Self-pay

## 2020-09-14 VITALS — BP 128/100 | HR 159 | Ht 68.0 in | Wt 298.0 lb

## 2020-09-14 DIAGNOSIS — I509 Heart failure, unspecified: Secondary | ICD-10-CM | POA: Insufficient documentation

## 2020-09-14 DIAGNOSIS — Z20822 Contact with and (suspected) exposure to covid-19: Secondary | ICD-10-CM | POA: Diagnosis present

## 2020-09-14 DIAGNOSIS — I517 Cardiomegaly: Secondary | ICD-10-CM | POA: Diagnosis not present

## 2020-09-14 DIAGNOSIS — I77810 Thoracic aortic ectasia: Secondary | ICD-10-CM | POA: Diagnosis present

## 2020-09-14 DIAGNOSIS — I4891 Unspecified atrial fibrillation: Secondary | ICD-10-CM | POA: Diagnosis not present

## 2020-09-14 DIAGNOSIS — I5021 Acute systolic (congestive) heart failure: Secondary | ICD-10-CM | POA: Diagnosis present

## 2020-09-14 DIAGNOSIS — Z6841 Body Mass Index (BMI) 40.0 and over, adult: Secondary | ICD-10-CM

## 2020-09-14 DIAGNOSIS — I429 Cardiomyopathy, unspecified: Secondary | ICD-10-CM | POA: Diagnosis present

## 2020-09-14 DIAGNOSIS — R0609 Other forms of dyspnea: Secondary | ICD-10-CM | POA: Diagnosis not present

## 2020-09-14 DIAGNOSIS — I480 Paroxysmal atrial fibrillation: Principal | ICD-10-CM | POA: Diagnosis present

## 2020-09-14 DIAGNOSIS — Z87891 Personal history of nicotine dependence: Secondary | ICD-10-CM | POA: Diagnosis not present

## 2020-09-14 DIAGNOSIS — Z79899 Other long term (current) drug therapy: Secondary | ICD-10-CM

## 2020-09-14 DIAGNOSIS — R600 Localized edema: Secondary | ICD-10-CM

## 2020-09-14 DIAGNOSIS — I959 Hypotension, unspecified: Secondary | ICD-10-CM | POA: Diagnosis present

## 2020-09-14 DIAGNOSIS — I110 Hypertensive heart disease with heart failure: Secondary | ICD-10-CM | POA: Diagnosis present

## 2020-09-14 DIAGNOSIS — R0600 Dyspnea, unspecified: Secondary | ICD-10-CM | POA: Diagnosis not present

## 2020-09-14 DIAGNOSIS — E8770 Fluid overload, unspecified: Secondary | ICD-10-CM | POA: Diagnosis not present

## 2020-09-14 DIAGNOSIS — R609 Edema, unspecified: Secondary | ICD-10-CM | POA: Diagnosis not present

## 2020-09-14 DIAGNOSIS — I340 Nonrheumatic mitral (valve) insufficiency: Secondary | ICD-10-CM | POA: Diagnosis not present

## 2020-09-14 DIAGNOSIS — R0602 Shortness of breath: Secondary | ICD-10-CM | POA: Diagnosis not present

## 2020-09-14 DIAGNOSIS — J9811 Atelectasis: Secondary | ICD-10-CM | POA: Diagnosis not present

## 2020-09-14 DIAGNOSIS — I5032 Chronic diastolic (congestive) heart failure: Secondary | ICD-10-CM | POA: Insufficient documentation

## 2020-09-14 HISTORY — DX: Essential (primary) hypertension: I10

## 2020-09-14 LAB — COMPREHENSIVE METABOLIC PANEL
ALT: 38 U/L (ref 0–44)
AST: 32 U/L (ref 15–41)
Albumin: 3.6 g/dL (ref 3.5–5.0)
Alkaline Phosphatase: 74 U/L (ref 38–126)
Anion gap: 9 (ref 5–15)
BUN: 18 mg/dL (ref 8–23)
CO2: 29 mmol/L (ref 22–32)
Calcium: 9.1 mg/dL (ref 8.9–10.3)
Chloride: 100 mmol/L (ref 98–111)
Creatinine, Ser: 1.06 mg/dL (ref 0.61–1.24)
GFR, Estimated: >60 mL/min (ref >60)
Glucose, Bld: 101 mg/dL — ABNORMAL HIGH (ref 70–99)
Potassium: 3.6 mmol/L (ref 3.5–5.1)
Sodium: 138 mmol/L (ref 135–145)
Total Bilirubin: 1.5 mg/dL — ABNORMAL HIGH (ref 0.3–1.2)
Total Protein: 6.8 g/dL (ref 6.5–8.1)

## 2020-09-14 LAB — RESP PANEL BY RT-PCR (FLU A&B, COVID) ARPGX2
Influenza A by PCR: NEGATIVE
Influenza B by PCR: NEGATIVE
SARS Coronavirus 2 by RT PCR: NEGATIVE

## 2020-09-14 LAB — CBC WITH DIFFERENTIAL/PLATELET
Abs Immature Granulocytes: 0 10*3/uL (ref 0.00–0.07)
Basophils Absolute: 0.1 10*3/uL (ref 0.0–0.1)
Basophils Relative: 1 %
Eosinophils Absolute: 0.2 10*3/uL (ref 0.0–0.5)
Eosinophils Relative: 3 %
HCT: 47.1 % (ref 39.0–52.0)
Hemoglobin: 15.4 g/dL (ref 13.0–17.0)
Immature Granulocytes: 0 %
Lymphocytes Relative: 32 %
Lymphs Abs: 2.5 10*3/uL (ref 0.7–4.0)
MCH: 31.7 pg (ref 26.0–34.0)
MCHC: 32.7 g/dL (ref 30.0–36.0)
MCV: 96.9 fL (ref 80.0–100.0)
Monocytes Absolute: 0.7 10*3/uL (ref 0.1–1.0)
Monocytes Relative: 9 %
Neutro Abs: 4.2 10*3/uL (ref 1.7–7.7)
Neutrophils Relative %: 55 %
Platelets: 209 10*3/uL (ref 150–400)
RBC: 4.86 MIL/uL (ref 4.22–5.81)
RDW: 16.3 % — ABNORMAL HIGH (ref 11.5–15.5)
WBC: 7.7 10*3/uL (ref 4.0–10.5)
nRBC: 0 % (ref 0.0–0.2)

## 2020-09-14 LAB — TROPONIN I (HIGH SENSITIVITY): Troponin I (High Sensitivity): 7 ng/L (ref <18)

## 2020-09-14 LAB — BRAIN NATRIURETIC PEPTIDE: B Natriuretic Peptide: 302.1 pg/mL — ABNORMAL HIGH (ref 0.0–100.0)

## 2020-09-14 MED ORDER — POTASSIUM CHLORIDE CRYS ER 10 MEQ PO TBCR
40.0000 meq | EXTENDED_RELEASE_TABLET | Freq: Once | ORAL | Status: AC
  Administered 2020-09-14: 40 meq via ORAL
  Filled 2020-09-14 (×2): qty 4

## 2020-09-14 MED ORDER — LORATADINE 10 MG PO TABS
10.0000 mg | ORAL_TABLET | Freq: Every day | ORAL | Status: DC
  Administered 2020-09-15 – 2020-09-19 (×5): 10 mg via ORAL
  Filled 2020-09-14 (×5): qty 1

## 2020-09-14 MED ORDER — ACETAMINOPHEN 325 MG PO TABS: 650.0000 mg | ORAL_TABLET | ORAL | Status: DC | PRN

## 2020-09-14 MED ORDER — ONDANSETRON HCL 4 MG/2ML IJ SOLN: 4.0000 mg | Freq: Four times a day (QID) | INTRAMUSCULAR | Status: DC | PRN

## 2020-09-14 MED ORDER — DILTIAZEM HCL 25 MG/5ML IV SOLN
10.0000 mg | Freq: Once | INTRAVENOUS | Status: AC
  Administered 2020-09-14: 10 mg via INTRAVENOUS
  Filled 2020-09-14: qty 5

## 2020-09-14 MED ORDER — DILTIAZEM HCL-DEXTROSE 125-5 MG/125ML-% IV SOLN (PREMIX)
5.0000 mg/h | INTRAVENOUS | Status: DC
Start: 2020-09-14 — End: 2020-09-15
  Administered 2020-09-14: 5 mg/h via INTRAVENOUS
  Filled 2020-09-14 (×2): qty 125

## 2020-09-14 MED ORDER — APIXABAN 5 MG PO TABS
5.0000 mg | ORAL_TABLET | Freq: Two times a day (BID) | ORAL | Status: DC
  Administered 2020-09-14 – 2020-09-19 (×10): 5 mg via ORAL
  Filled 2020-09-14 (×6): qty 1
  Filled 2020-09-14: qty 2
  Filled 2020-09-14 (×3): qty 1

## 2020-09-14 MED ORDER — VALSARTAN-HYDROCHLOROTHIAZIDE 80-12.5 MG PO TABS: 1.0000 | ORAL_TABLET | Freq: Every day | ORAL | Status: DC

## 2020-09-14 MED ORDER — FUROSEMIDE 10 MG/ML IJ SOLN
40.0000 mg | Freq: Once | INTRAMUSCULAR | Status: AC
  Administered 2020-09-14: 40 mg via INTRAVENOUS
  Filled 2020-09-14: qty 4

## 2020-09-14 MED ORDER — IRBESARTAN 75 MG PO TABS
75.0000 mg | ORAL_TABLET | Freq: Every day | ORAL | Status: DC
  Administered 2020-09-15: 75 mg via ORAL
  Filled 2020-09-14 (×3): qty 1

## 2020-09-14 MED ORDER — HYDROCHLOROTHIAZIDE 12.5 MG PO CAPS
12.5000 mg | ORAL_CAPSULE | Freq: Every day | ORAL | Status: DC
  Administered 2020-09-15: 12.5 mg via ORAL
  Filled 2020-09-14: qty 1

## 2020-09-14 NOTE — ED Provider Notes (Signed)
[facility] EMERGENCY DEPARTMENT Provider Note   CSN: [id number] Arrival date & time: 09/14/20  1702     History Chief Complaint  Patient presents with  . Shortness of Breath    Ronald [name] is a 80 y.o. male hx of HTN, here presenting with leg swelling and shortness of breath with exertion and new onset A. fib.  Patient apparently had a fall about 9 weeks ago and for the last several months he has been having progressively worsening shortness of breath and leg swelling.  Patient was seen at primary care office about a week ago and was thought to have new onset CHF.  He was prescribed 20 mg of Lasix daily.  He states that his weight has improved and he was referred to cardiology outpatient.  He was seen by cardiology today and was noted to be in rapid A. fib that is new onset.  He states that he has no palpitations.  When he takes his blood pressure he notices that his heart rate is in the 130s and irregular per the blood pressure cuff.  Patient was sent here for diuresis and management of rapid A. fib.  Patient has a CHA2DS2-VASc score of 3 and cardiology recommended admission for A. fib and CHF  The history is provided by the patient.       Past Medical History:  Diagnosis Date  . Hypertension   . Obesity (BMI 30-39.9) 06/24/2020    Patient Active Problem List   Diagnosis Date Noted  . Paroxysmal atrial fibrillation ([place]) 09/14/2020  . Hypervolemia 09/14/2020  . Congestive heart failure ([place]) 09/14/2020  . Morbid obesity ([place]) 09/14/2020  . Atrial fibrillation with RVR ([place]) 09/14/2020  . Obesity (BMI 30-39.9) 06/24/2020    Past Surgical History:  Procedure Laterality Date  . TONSILLECTOMY         History reviewed. No pertinent family history.  Social History   Tobacco Use  . Smoking status: Former Research scientist (life sciences)  . Smokeless tobacco: Never Used  Substance Use Topics  . Alcohol use: Yes    Home Medications Prior to Admission medications   Medication Sig  Start Date End Date Taking? Authorizing Provider  brimonidine-timolol (COMBIGAN) 0.2-0.5 % ophthalmic solution Place 1 drop into both eyes every 12 (twelve) hours.    [provider]  clobetasol cream (TEMOVATE) 5.80 % Apply 1 application topically daily as needed.    [provider]  furosemide (LASIX) 20 MG tablet Take 1 tablet (20 mg total) by mouth daily. 09/08/20   Camillia Herter, NP  loratadine (CLARITIN) 10 MG tablet Take 10 mg by mouth daily.    [provider]  Multiple Vitamin (MULTIVITAMIN) tablet Take 1 tablet by mouth daily.    [provider]  Multiple Vitamins-Minerals (PRESERVISION AREDS 2) CAPS Take 1 capsule by mouth daily.    [provider]  naproxen (NAPROSYN) 500 MG tablet Take 500 mg by mouth 2 (two) times daily as needed.    [provider]  Omega 3 1200 MG CAPS Take 1 capsule by mouth daily.    [provider]  valsartan-hydrochlorothiazide (DIOVAN-HCT) 80-12.5 MG tablet Take 1 tablet by mouth daily. 12/22/19   Nicolette Bang, DO  vitamin B-12 (CYANOCOBALAMIN) 1000 MCG tablet Take 1,000 mcg by mouth daily.    [provider]    Allergies    Patient has no known allergies.  Review of Systems   Review of Systems  Respiratory: Positive for shortness of breath.  All other systems reviewed and are negative.   Physical Exam Updated Vital Signs BP (!) 126/106   Pulse (!) 150   Temp 98.7 F (37.1 C)   Resp 19   Ht 5\' 9"  (1.753 m)   Wt (!) 136.3 kg   SpO2 95%   BMI 44.38 kg/m   Physical Exam Vitals and nursing note reviewed.  HENT:     Head: Normocephalic.  Eyes:     Extraocular Movements: Extraocular movements intact.     Pupils: Pupils are equal, round, and reactive to light.  Cardiovascular:     Rate and Rhythm: Tachycardia present. Rhythm irregular.  Pulmonary:     Comments: Diminished breath sounds bilaterally Abdominal:     General: Bowel sounds are normal.      Palpations: Abdomen is soft.  Musculoskeletal:     Comments: 2+ edema  Skin:    General: Skin is warm.     Capillary Refill: Capillary refill takes less than 2 seconds.  Neurological:     General: No focal deficit present.     Mental Status: He is alert and oriented to person, place, and time.  Psychiatric:        Mood and Affect: Mood normal.        Behavior: Behavior normal.     ED Results / Procedures / Treatments   Labs (all labs ordered are listed, but only abnormal results are displayed) Labs Reviewed  CBC WITH DIFFERENTIAL/PLATELET - Abnormal; Notable for the following components:      Result Value   RDW 16.3 (*)    All other components within normal limits  COMPREHENSIVE METABOLIC PANEL - Abnormal; Notable for the following components:   Glucose, Bld 101 (*)    Total Bilirubin 1.5 (*)    All other components within normal limits  BRAIN NATRIURETIC PEPTIDE - Abnormal; Notable for the following components:   B Natriuretic Peptide 302.1 (*)    All other components within normal limits  RESP PANEL BY RT-PCR (FLU A&B, COVID) ARPGX2  TROPONIN I (HIGH SENSITIVITY)    EKG EKG Interpretation  Date/Time:  Wednesday September 14 2020 17:11:20 EDT Ventricular Rate:  150 PR Interval:  124 QRS Duration: 132 QT Interval:  336 QTC Calculation: 531 R Axis:   -73 Text Interpretation: afib with RVR Right bundle branch block No previous ECGs available Confirmed by Wandra Arthurs [phone]) on 09/14/2020 5:13:20 PM   Radiology DG Chest Port 1 View  Result Date: 09/14/2020 CLINICAL DATA:  Shortness of breath EXAM: PORTABLE CHEST 1 VIEW COMPARISON:  None. FINDINGS: Cardiomegaly. Possible small pleural effusions. Hazy atelectasis at the bases. No overt edema. No pneumothorax. IMPRESSION: Cardiomegaly with possible small pleural effusions and hazy atelectasis at the bases. Electronically Signed   By: Donavan Foil M.D.   On: 09/14/2020 17:30    Procedures Procedures   CRITICAL  CARE Performed by: Wandra Arthurs   Total critical care time: 30 minutes  Critical care time was exclusive of separately billable procedures and treating other patients.  Critical care was necessary to treat or prevent imminent or life-threatening deterioration.  Critical care was time spent personally by me on the following activities: development of treatment plan with patient and/or surrogate as well as nursing, discussions with consultants, evaluation of patient's response to treatment, examination of patient, obtaining history from patient or surrogate, ordering and performing treatments and interventions, ordering and review of laboratory studies, ordering and review of radiographic studies, pulse oximetry and re-evaluation of patient's  condition.   Medications Ordered in ED Medications  diltiazem (CARDIZEM) 125 mg in dextrose 5% 125 mL (1 mg/mL) infusion (has no administration in time range)  apixaban (ELIQUIS) tablet 5 mg (has no administration in time range)  diltiazem (CARDIZEM) injection 10 mg (10 mg Intravenous Given 09/14/20 1726)  furosemide (LASIX) injection 40 mg (40 mg Intravenous Given 09/14/20 1727)    ED Course  I have reviewed the triage vital signs and the nursing notes.  Pertinent labs & imaging results that were available during my care of the patient were reviewed by me and considered in my medical decision making (see chart for details).    MDM Rules/Calculators/A&P                          Rontae Inglett is a 80 y.o. male here presenting with shortness of breath and new onset rapid A. Fib.  Patient's heart rate is in the 150s and regular.  CBC and CMP unremarkable.  BNP is 300.  Chest x-ray showed pulmonary edema.  I started patient on Cardizem drip and given Lasix for diuresis.  Also dose Eliquis for A. Fib. Since his CHADVASC score is 3.  Discussed with cardiology and cardiology will admit to [facility].  Final Clinical Impression(s) / ED Diagnoses Final diagnoses:   Atrial fibrillation, unspecified type ([place])  Heart failure, unspecified HF chronicity, unspecified heart failure type [facility])    Rx / DC Orders ED Discharge Orders    None       Drenda Freeze, MD 09/14/20 [phone]

## 2020-09-14 NOTE — ED Notes (Signed)
ED Provider at bedside. 

## 2020-09-14 NOTE — H&P (Signed)
Cardiology Admission History and Physical:   Patient ID: Ronald [name]; MRN: [id number]; DOB: [birth year]   Admission date: 09/14/2020  Primary Care Provider: Nicolette Bang, DO Primary Cardiologist: Berniece Salines, DO    Chief Complaint: Dyspnea on exertion  History of Present Illness:   Ronald [name] is a 80 y.o. male with a history of essential hypertension, chronic leg edema and obesity, who presented to the hospital with complaints of shortness of breath on exertion.  Patient had noted that he would get winded after walking approximately 100 feet.  More recently he has had shortness of breath on minimal exertion.  He has had a 40 pound weight gain in the last 3 months. He slipped on ice in Jan 2022. He feels that he got much worse afterwards. He remembers his BP machine recording heart rates as high as the 130s back then. Also he had low blood pressures. He never sought medical attention for these findings.  He denies any chest pain.  He does have orthopnea.  Patient also notes worsening leg swelling.  He was started on Lasix a few days ago (09/06/20) by a Nurse Practitioner but he has not seen much improvement in his shortness of breath.  He was seen in the cardiology clinic earlier today (09/14/20) and was documented to have atrial fibrillation with RVR.  He was therefore sent to the ED for an admission for control of his heart rate and possible diuresis for CHF.  In the ED at [facility] his heart rate was noted to be in the 150s (blood pressure 118/92 mmHg).  The ECG confirmed atrial fibrillation with rapid ventricular rate.  Chest x-ray showed cardiomegaly with possible small pleural effusions and hazy atelectasis at the bases.  He was started on IV Cardizem for rate control.  He was also given Eliquis and IV Lasix.  The labs were: BNP 302, K 3.6, Cr 1.06, hs-Trop 7, WBC 7.7, Hgb 15.4, Hct 47.1 and Plt 209.    Past Medical History:  Diagnosis Date  . Hypertension    . Obesity (BMI 30-39.9) 06/24/2020    Past Surgical History:  Procedure Laterality Date  . TONSILLECTOMY       Medications Prior to Admission: Prior to Admission medications   Medication Sig Start Date End Date Taking? Authorizing Provider  brimonidine-timolol (COMBIGAN) 0.2-0.5 % ophthalmic solution Place 1 drop into both eyes every 12 (twelve) hours.    [provider]  clobetasol cream (TEMOVATE) 2.40 % Apply 1 application topically daily as needed.    [provider]  furosemide (LASIX) 20 MG tablet Take 1 tablet (20 mg total) by mouth daily. 09/08/20   Camillia Herter, NP  loratadine (CLARITIN) 10 MG tablet Take 10 mg by mouth daily.    [provider]  Multiple Vitamin (MULTIVITAMIN) tablet Take 1 tablet by mouth daily.    [provider]  Multiple Vitamins-Minerals (PRESERVISION AREDS 2) CAPS Take 1 capsule by mouth daily.    [provider]  naproxen (NAPROSYN) 500 MG tablet Take 500 mg by mouth 2 (two) times daily as needed.    [provider]  Omega 3 1200 MG CAPS Take 1 capsule by mouth daily.    [provider]  valsartan-hydrochlorothiazide (DIOVAN-HCT) 80-12.5 MG tablet Take 1 tablet by mouth daily. 12/22/19   Nicolette Bang, DO  vitamin B-12 (CYANOCOBALAMIN) 1000 MCG tablet Take 1,000 mcg by mouth daily.    [provider]     Allergies:  No Known Allergies  Social History:   Social History   Socioeconomic History  . Marital status: Married    Spouse name: Not on file  . Number of children: Not on file  . Years of education: Not on file  . Highest education level: Not on file  Occupational History  . Not on file  Tobacco Use  . Smoking status: Former Research scientist (life sciences)  . Smokeless tobacco: Never Used  Substance and Sexual Activity  . Alcohol use: Yes  . Drug use: Not on file  . Sexual activity: Not on file  Other Topics Concern  . Not on file  Social History Narrative  . Not on file    Social Determinants of Health   Financial Resource Strain: Not on file  Food Insecurity: Not on file  Transportation Needs: Not on file  Physical Activity: Not on file  Stress: Not on file  Social Connections: Not on file  Intimate Partner Violence: Not on file     Family History:   The patient's family history is not on file.     Review of Systems: [y] = yes, [ ]  = no   . General: Weight gain [ ] ; Weight loss [ ] ; Anorexia [ ] ; Fatigue [ ] ; Fever [ ] ; Chills [ ] ; Weakness [ ]   . Cardiac: Chest pain/pressure [ ] ; Resting SOB [ ] ; Exertional SOB [Y]; Orthopnea [ ] ; Pedal Edema [Y]; Palpitations [ ] ; Syncope [ ] ; Presyncope [ ] ; Paroxysmal nocturnal dyspnea[ ]   . Pulmonary: Cough [ ] ; Wheezing[ ] ; Hemoptysis[ ] ; Sputum [ ] ; Snoring [ ]   . GI: Vomiting[ ] ; Dysphagia[ ] ; Melena[ ] ; Hematochezia [ ] ; Heartburn[ ] ; Abdominal pain [ ] ; Constipation [ ] ; Diarrhea [ ] ; BRBPR [ ]   . GU: Hematuria[ ] ; Dysuria [ ] ; Nocturia[ ]   . Vascular: Pain in legs with walking [ ] ; Pain in feet with lying flat [ ] ; Non-healing sores [ ] ; Stroke [ ] ; TIA [ ] ; Slurred speech [ ] ;  . Neuro: Headaches[ ] ; Vertigo[ ] ; Seizures[ ] ; Paresthesias[ ] ;Blurred vision [ ] ; Diplopia [ ] ; Vision changes [ ]   . Ortho/Skin: Arthritis [ ] ; Joint pain [ ] ; Muscle pain [ ] ; Joint swelling [ ] ; Back Pain [ ] ; Rash [ ]   . Psych: Depression[ ] ; Anxiety[ ]   . Heme: Bleeding problems [ ] ; Clotting disorders [ ] ; Anemia [ ]   . Endocrine: Diabetes [ ] ; Thyroid dysfunction[ ]      Physical Exam/Data:   Vitals:   09/14/20 1815 09/14/20 1845 09/14/20 1900 09/14/20 1915  BP: 111/89 (!) 118/102 (!) 123/98 (!) 121/95  Pulse: (!) 138 (!) 140 (!) 150 (!) 148  Resp: (!) 25 (!) 25 17 17   Temp:      SpO2: 94% 93% 96% 96%  Weight:      Height:        Intake/Output Summary (Last 24 hours) at 09/14/2020 2032 Last data filed at 09/14/2020 1923 Gross per 24 hour  Intake --  Output 1850 ml  Net -1850 ml   Filed Weights   09/14/20  1709  Weight: (!) 136.3 kg   Body mass index is 44.38 kg/m.  General:  Well nourished, well developed, in no acute distress, obese HEENT: normal Lymph: no adenopathy Neck: no JVD Endocrine:  No thryomegaly Vascular: No carotid bruits; FA pulses 2+ bilaterally without bruits  Cardiac: Irregularly irregular, tachycardic, no murmur Lungs:  clear to auscultation bilaterally, no wheezing, rhonchi or rales  Abd: soft, nontender, no hepatomegaly  Ext: 2+ edema bilateral lower extremities Musculoskeletal:  No deformities, BUE and BLE strength normal and equal Skin: warm and dry  Neuro:  CNs 2-12 intact, no focal abnormalities noted Psych:  Normal affect    Laboratory Data:  Chemistry Recent Labs  Lab 09/14/20 1715  NA 138  K 3.6  CL 100  CO2 29  GLUCOSE 101*  BUN 18  CREATININE 1.06  CALCIUM 9.1  GFRNONAA >60  ANIONGAP 9    Recent Labs  Lab 09/14/20 1715  PROT 6.8  ALBUMIN 3.6  AST 32  ALT 38  ALKPHOS 74  BILITOT 1.5*   Hematology Recent Labs  Lab 09/14/20 1715  WBC 7.7  RBC 4.86  HGB 15.4  HCT 47.1  MCV 96.9  MCH 31.7  MCHC 32.7  RDW 16.3*  PLT 209   Cardiac EnzymesNo results for input(s): TROPONINI in the last 168 hours. No results for input(s): TROPIPOC in the last 168 hours.  BNP Recent Labs  Lab 09/14/20 1715  BNP 302.1*    DDimer No results for input(s): DDIMER in the last 168 hours.  Radiology/Studies:  DG Chest Port 1 View  Result Date: 09/14/2020 CLINICAL DATA:  Shortness of breath EXAM: PORTABLE CHEST 1 VIEW COMPARISON:  None. FINDINGS: Cardiomegaly. Possible small pleural effusions. Hazy atelectasis at the bases. No overt edema. No pneumothorax. IMPRESSION: Cardiomegaly with possible small pleural effusions and hazy atelectasis at the bases. Electronically Signed   By: Donavan Foil M.D.   On: 09/14/2020 17:30    Assessment and Plan:   1. Atrial fibrillation with rapid ventricular rate The patient is newly diagnosed with atrial  fibrillation.  He is documented to have a rapid ventricular rate in the 150s.  He was started on a IV Cardizem infusion. The CHA2DS2-VASc score is 3 and he has also been initiated on Eliquis.   -Continue IV Cardizem infusion for rate control -Continue Eliquis  After his initial work-up (and adequate diuresis) the patient might be a candidate for a TEE and cardioversion   2.   Lower extremity edema The patient has had increasing weight gain and leg edema for the past few months.  Clinically he appears to be volume overloaded.  He was recently started on Lasix with only minimal improvement in his symptoms.  -Transthoracic echocardiogram to evaluate LV function -Strict I and O's -Daily weights -Lasix 40 mg IV daily -Maintain serum K >2.0 and Mg >2.0   Severity of Illness: The appropriate patient status for this patient is INPATIENT. Inpatient status is judged to be reasonable and necessary in order to provide the required intensity of service to ensure the patient's safety. The patient's presenting symptoms, physical exam findings, and initial radiographic and laboratory data in the context of their chronic comorbidities is felt to place them at high risk for further clinical deterioration. Furthermore, it is not anticipated that the patient will be medically stable for discharge from the hospital within 2 midnights of admission. The following factors support the patient status of inpatient.   " The patient's presenting symptoms include SOB. " The worrisome physical exam findings include tachycardia. " The initial radiographic and laboratory data are worrisome because of atrial fibrillation with RVR. " The chronic co-morbidities include HTN and obesity.   * I certify that at the point of admission it is my clinical judgment that the patient will require inpatient hospital care spanning beyond 2 midnights from the point of admission due to high intensity of service, high risk for further  deterioration and high frequency of surveillance required.*    For questions or updates, please contact [place] Please consult [email or web address] for contact info under Cardiology/STEMI.    Signed, Meade Maw, MD  09/14/2020 8:32 PM

## 2020-09-14 NOTE — ED Triage Notes (Addendum)
Brought down by Cards office for eval for SOB with exertion ? New onset a fib

## 2020-09-14 NOTE — Progress Notes (Addendum)
Cardiology Office Note:    Date:  09/14/2020   ID:  Ronald [name], DOB 08-03-1940, MRN [id number]  PCP:  Nicolette Bang, DO  Cardiologist:  Berniece Salines, DO  Electrophysiologist:  None   Referring MD: Camillia Herter, NP   Dyspnea on exertion   History of Present Illness:    Ronald [name] is a 80 y.o. male with a hx of obesity and  HTN presents with dyspnea on exertion. Pt reports these symptoms have been going on for the last 4 months. He was initially able to walk to 167m before feeling dypsnea and now he feels dyspnea when getting on to the chair. He endorses orthopnea and cannot get comfortable at night. He saw FM NP who put him on lasix 20mg  a few days ago without significant improvement in symptoms. Of note pt has gained 40lb in the last 3 months and has gained 3lb this week.  He has been checking his HR at home which has been irregular for the last 2 months and occasionally HR>130bpm.  Past Medical History:  Diagnosis Date  . Hypertension   . Obesity (BMI 30-39.9) 06/24/2020    Past Surgical History:  Procedure Laterality Date  . TONSILLECTOMY      Current Medications: Current Meds  Medication Sig  . brimonidine-timolol (COMBIGAN) 0.2-0.5 % ophthalmic solution Place 1 drop into both eyes every 12 (twelve) hours.  . clobetasol cream (TEMOVATE) 6.28 % Apply 1 application topically daily as needed.  . furosemide (LASIX) 20 MG tablet Take 1 tablet (20 mg total) by mouth daily.  Marland Kitchen loratadine (CLARITIN) 10 MG tablet Take 10 mg by mouth daily.  . Multiple Vitamin (MULTIVITAMIN) tablet Take 1 tablet by mouth daily.  . Multiple Vitamins-Minerals (PRESERVISION AREDS 2) CAPS Take 1 capsule by mouth daily.  . naproxen (NAPROSYN) 500 MG tablet Take 500 mg by mouth 2 (two) times daily as needed.  . Omega 3 1200 MG CAPS Take 1 capsule by mouth daily.  . valsartan-hydrochlorothiazide (DIOVAN-HCT) 80-12.5 MG tablet Take 1 tablet by mouth daily.  . vitamin B-12  (CYANOCOBALAMIN) 1000 MCG tablet Take 1,000 mcg by mouth daily.     Allergies:   Patient has no known allergies.   Social History   Socioeconomic History  . Marital status: Married    Spouse name: Not on file  . Number of children: Not on file  . Years of education: Not on file  . Highest education level: Not on file  Occupational History  . Not on file  Tobacco Use  . Smoking status: Former Research scientist (life sciences)  . Smokeless tobacco: Never Used  Substance and Sexual Activity  . Alcohol use: Yes  . Drug use: Not on file  . Sexual activity: Not on file  Other Topics Concern  . Not on file  Social History Narrative  . Not on file   Social Determinants of Health   Financial Resource Strain: Not on file  Food Insecurity: Not on file  Transportation Needs: Not on file  Physical Activity: Not on file  Stress: Not on file  Social Connections: Not on file     Family History: The patient's family history is not on file.  ROS:   Review of Systems  Constitution: Negative for decreased appetite, fever and weight gain.  HENT: Negative for congestion, ear discharge, hoarse voice and sore throat.   Eyes: Negative for discharge, redness, vision loss in right eye and visual halos.  Cardiovascular: Negative for chest pain, dyspnea on exertion, leg  swelling, orthopnea and palpitations.  Respiratory: Negative for cough, hemoptysis, shortness of breath and snoring.   Endocrine: Negative for heat intolerance and polyphagia.  Hematologic/Lymphatic: Negative for bleeding problem. Does not bruise/bleed easily.  Skin: Negative for flushing, nail changes, rash and suspicious lesions.  Musculoskeletal: Negative for arthritis, joint pain, muscle cramps, myalgias, neck pain and stiffness.  Gastrointestinal: Negative for abdominal pain, bowel incontinence, diarrhea and excessive appetite.  Genitourinary: Negative for decreased libido, genital sores and incomplete emptying.  Neurological: Negative for brief  paralysis, focal weakness, headaches and loss of balance.  Psychiatric/Behavioral: Negative for altered mental status, depression and suicidal ideas.  Allergic/Immunologic: Negative for HIV exposure and persistent infections.    EKGs/Labs/Other Studies Reviewed:    The following studies were reviewed today: A fib RVR, RBBB   EKG:  The ekg ordered today demonstrates   Recent Labs: 09/06/2020: ALT 59; BNP CANCELED; BUN 22; Creatinine, Ser 0.96; Potassium 4.2; Sodium 143 09/14/2020: Hemoglobin 15.4; Platelets 209  Recent Lipid Panel    Component Value Date/Time   CHOL 203 05/21/2019 0000   TRIG 113 05/21/2019 0000   HDL 62 05/21/2019 0000   CHOLHDL 3.3 05/21/2019 0000   LDLCALC 121 05/21/2019 0000    Physical Exam:    VS:  BP (!) 128/100 (BP Location: Right Arm)   Pulse (!) 159   Ht 5\' 8"  (1.727 m)   Wt 298 lb (135.2 kg)   SpO2 96%   BMI 45.31 kg/m     Wt Readings from Last 3 Encounters:  09/14/20 (!) 300 lb 8 oz (136.3 kg)  09/14/20 298 lb (135.2 kg)  09/06/20 291 lb 3.2 oz (132.1 kg)     GEN: Well nourished, well developed, mild dyspnea at rest  HEENT: Normal NECK: Noted JVD; No carotid bruits LYMPHATICS: No lymphadenopathy CARDIAC: irregularly irregular rhythm, tachycardic  RESPIRATORY:  Bilateral fine crackles up to mid zone bilaterally ABDOMEN:  distended abdomen  EXTREMITIES: 3+ peripheral up to abdomen bilaterally-anasarca MUSCULOSKELETAL:  No deformity  SKIN: Warm and dry NEUROLOGIC:  Alert and oriented x 3, non-focal PSYCHIATRIC:  Normal affect, good insight  ASSESSMENT:    1. Dyspnea on exertion   2. Paroxysmal atrial fibrillation (HCC)   3. Hypervolemia, unspecified hypervolemia type   4. Other congestive heart failure ([place])   5. Morbid obesity ([place])    PLAN:     1. Dyspnea on exertion which could be multifactorial he appears to be volume overloaded with significant anasarca on examination today with elevated JVD. Discussed ER admission for IV  diuresis. Pt aggreable for hospital admission.  I suspect this was almost secondary to congestive heart failure in the setting of atrial fibrillation.  His EF is unknown.  He will benefit from an echocardiogram to assess his LV function.  2. New onset A fib RVR, CHA2DS2-VASc-3.  He is with rapid ventricular rate today.  Ideally given his significant volume overload and his rapid ventricular rate atrial fibrillation he should be admitted in the hospital.  Hopefully while inpatient he may be able to rate controlled and potentially converted to sinus rhythm.  I educated the patient about his new diagnosis including rate control, rhythm control as well as stroke prevention with anticoagulation.  3 Diastolic hypertension- continue to monitor with diuretics while inpatient.   We did take the patient down to emergency department.  Discussed with the ED.   The patient is in agreement with the above plan. The patient left the office in stable condition.  The patient will  follow up in   Medication Adjustments/Labs and Tests Ordered: Current medicines are reviewed at length with the patient today.  Concerns regarding medicines are outlined above.  No orders of the defined types were placed in this encounter.  No orders of the defined types were placed in this encounter.   There are no Patient Instructions on file for this visit.   Adopting a Healthy Lifestyle.  Know what a healthy weight is for you (roughly BMI <25) and aim to maintain this   Aim for 7+ servings of fruits and vegetables daily   65-80+ fluid ounces of water or unsweet tea for healthy kidneys   Limit to max 1 drink of alcohol per day; avoid smoking/tobacco   Limit animal fats in diet for cholesterol and heart health - choose grass fed whenever available   Avoid highly processed foods, and foods high in saturated/trans fats   Aim for low stress - take time to unwind and care for your mental health   Aim for 150 min of moderate  intensity exercise weekly for heart health, and weights twice weekly for bone health   Aim for 7-9 hours of sleep daily   When it comes to diets, agreement about the perfect plan isnt easy to find, even among the experts. Experts at the [employer] developed an idea known as the Healthy Eating Plate. Just imagine a plate divided into logical, healthy portions.   The emphasis is on diet quality:   Load up on vegetables and fruits - one-half of your plate: Aim for color and variety, and remember that potatoes dont count.   Go for whole grains - one-quarter of your plate: Whole wheat, barley, wheat berries, quinoa, oats, brown rice, and foods made with them. If you want pasta, go with whole wheat pasta.   Protein power - one-quarter of your plate: Fish, chicken, beans, and nuts are all healthy, versatile protein sources. Limit red meat.   The diet, however, does go beyond the plate, offering a few other suggestions.   Use healthy plant oils, such as olive, canola, soy, corn, sunflower and peanut. Check the labels, and avoid partially hydrogenated oil, which have unhealthy trans fats.   If youre thirsty, drink water. Coffee and tea are good in moderation, but skip sugary drinks and limit milk and dairy products to one or two daily servings.   The type of carbohydrate in the diet is more important than the amount. Some sources of carbohydrates, such as vegetables, fruits, whole grains, and beans-are healthier than others.   Finally, stay active  Signed, Berniece Salines, DO  09/14/2020 5:23 PM    [facility]

## 2020-09-15 ENCOUNTER — Other Ambulatory Visit (HOSPITAL_COMMUNITY): Payer: Self-pay

## 2020-09-15 ENCOUNTER — Inpatient Hospital Stay (HOSPITAL_COMMUNITY): Payer: Medicare Other

## 2020-09-15 DIAGNOSIS — R0609 Other forms of dyspnea: Secondary | ICD-10-CM

## 2020-09-15 DIAGNOSIS — I4891 Unspecified atrial fibrillation: Secondary | ICD-10-CM | POA: Diagnosis not present

## 2020-09-15 LAB — CBC
HCT: 41.3 % (ref 39.0–52.0)
Hemoglobin: 13.6 g/dL (ref 13.0–17.0)
MCH: 32.1 pg (ref 26.0–34.0)
MCHC: 32.9 g/dL (ref 30.0–36.0)
MCV: 97.4 fL (ref 80.0–100.0)
Platelets: 173 10*3/uL (ref 150–400)
RBC: 4.24 MIL/uL (ref 4.22–5.81)
RDW: 16.4 % — ABNORMAL HIGH (ref 11.5–15.5)
WBC: 6.2 10*3/uL (ref 4.0–10.5)
nRBC: 0 % (ref 0.0–0.2)

## 2020-09-15 LAB — BASIC METABOLIC PANEL
Anion gap: 6 (ref 5–15)
BUN: 15 mg/dL (ref 8–23)
CO2: 33 mmol/L — ABNORMAL HIGH (ref 22–32)
Calcium: 8.8 mg/dL — ABNORMAL LOW (ref 8.9–10.3)
Chloride: 100 mmol/L (ref 98–111)
Creatinine, Ser: 1.13 mg/dL (ref 0.61–1.24)
GFR, Estimated: >60 mL/min (ref >60)
Glucose, Bld: 111 mg/dL — ABNORMAL HIGH (ref 70–99)
Potassium: 3.6 mmol/L (ref 3.5–5.1)
Sodium: 139 mmol/L (ref 135–145)

## 2020-09-15 LAB — HEMOGLOBIN A1C
Hgb A1c MFr Bld: 5.5 % (ref 4.8–5.6)
Mean Plasma Glucose: 111.15 mg/dL

## 2020-09-15 LAB — ECHOCARDIOGRAM COMPLETE
AR max vel: 1.69 cm2
AV Area VTI: 1.7 cm2
AV Area mean vel: 1.49 cm2
AV Mean grad: 2 mmHg
AV Peak grad: 4.7 mmHg
Ao pk vel: 1.08 m/s
Calc EF: 36.8 %
Height: 68 in
S' Lateral: 4.5 cm
Single Plane A2C EF: 17 %
Single Plane A4C EF: 49.7 %
Weight: 4582.4 oz

## 2020-09-15 LAB — LIPID PANEL
Cholesterol: 142 mg/dL (ref 0–200)
HDL: 58 mg/dL (ref >40)
LDL Cholesterol: 72 mg/dL (ref 0–99)
Total CHOL/HDL Ratio: 2.4 RATIO
Triglycerides: 61 mg/dL (ref <150)
VLDL: 12 mg/dL (ref 0–40)

## 2020-09-15 LAB — PROTIME-INR
INR: 1.3 — ABNORMAL HIGH (ref 0.8–1.2)
Prothrombin Time: 15.8 seconds — ABNORMAL HIGH (ref 11.4–15.2)

## 2020-09-15 LAB — T4, FREE: Free T4: 1.29 ng/dL — ABNORMAL HIGH (ref 0.61–1.12)

## 2020-09-15 LAB — TSH: TSH: 2.672 u[IU]/mL (ref 0.350–4.500)

## 2020-09-15 MED ORDER — POTASSIUM CHLORIDE CRYS ER 20 MEQ PO TBCR
40.0000 meq | EXTENDED_RELEASE_TABLET | Freq: Once | ORAL | Status: AC
  Administered 2020-09-15: 40 meq via ORAL
  Filled 2020-09-15: qty 2

## 2020-09-15 MED ORDER — FUROSEMIDE 10 MG/ML IJ SOLN
40.0000 mg | Freq: Two times a day (BID) | INTRAMUSCULAR | Status: DC
  Administered 2020-09-15 – 2020-09-16 (×3): 40 mg via INTRAVENOUS
  Filled 2020-09-15 (×6): qty 4

## 2020-09-15 MED ORDER — PERFLUTREN LIPID MICROSPHERE
1.0000 mL | INTRAVENOUS | Status: AC | PRN
  Administered 2020-09-15: 2 mL via INTRAVENOUS
  Filled 2020-09-15: qty 10

## 2020-09-15 MED ORDER — AMIODARONE HCL 200 MG PO TABS
400.0000 mg | ORAL_TABLET | Freq: Three times a day (TID) | ORAL | Status: DC
  Administered 2020-09-15 – 2020-09-19 (×14): 400 mg via ORAL
  Filled 2020-09-15 (×15): qty 2

## 2020-09-15 MED ORDER — METOPROLOL TARTRATE 50 MG PO TABS
50.0000 mg | ORAL_TABLET | Freq: Four times a day (QID) | ORAL | Status: DC
  Administered 2020-09-15 (×4): 50 mg via ORAL
  Filled 2020-09-15 (×4): qty 1

## 2020-09-15 NOTE — Progress Notes (Signed)
Progress Note  Patient Name: Ronald [name] Date of Encounter: 09/15/2020  [facility] Cardiologist: Berniece Salines, DO   Subjective   80 year old male with hypertension chronic lower extremity edema obesity presented with shortness of breath on exertion, winded at 100 feet, 40 pound weight gain in the last 3 months, fall on ice in January, orthopnea.  Recently started Lasix but not much improvement.  Was found to be in atrial fibrillation with RVR and sent to ER for further evaluation.  He was started on IV Cardizem for rate control, Eliquis.  Troponin normal  Inpatient Medications    Scheduled Meds: . apixaban  5 mg Oral BID  . irbesartan  75 mg Oral Daily   And  . hydrochlorothiazide  12.5 mg Oral Daily  . loratadine  10 mg Oral Daily   Continuous Infusions: . diltiazem (CARDIZEM) infusion 5 mg/hr (09/15/20 0242)   PRN Meds: acetaminophen, ondansetron (ZOFRAN) IV   Vital Signs    Vitals:   09/14/20 2052 09/15/20 0023 09/15/20 0417 09/15/20 0815  BP: (!) 140/98 94/70 110/81 (!) 113/99  Pulse: (!) 59 74 96 92  Resp: 18 17 19 20   Temp: 98 F (36.7 C) 97.7 F (36.5 C) 97.7 F (36.5 C) 97.7 F (36.5 C)  TempSrc: Oral Oral Oral Oral  SpO2: 97% 93% 96% 95%  Weight: 130.6 kg  129.9 kg   Height: 5\' 8"  (1.727 m)       Intake/Output Summary (Last 24 hours) at 09/15/2020 0928 Last data filed at 09/14/2020 2100 Gross per 24 hour  Intake 180 ml  Output 2050 ml  Net -1870 ml   Last 3 Weights 09/15/2020 09/14/2020 09/14/2020  Weight (lbs) 286 lb 6.4 oz 288 lb 300 lb 8 oz  Weight (kg) 129.91 kg 130.636 kg 136.306 kg      Telemetry    Atrial fibrillation ranging from 130s down to the 80s- Personally Reviewed  ECG    Atrial fibrillation with rapid ventricular response- Personally Reviewed  Physical Exam   GEN: No acute distress.   Neck: No JVD Cardiac:  Irregularly irregular, no murmurs, rubs, or gallops.  Respiratory:  Mildly decreased at bases bilaterally.   Mildly increased respiratory effort after turning in bed GI: Soft, nontender, non-distended, protuberant MS:  2+ lower extremity edema; No deformity. Neuro:  Nonfocal  Psych: Normal affect   Labs    High Sensitivity Troponin:   Recent Labs  Lab 09/14/20 1715  TROPONINIHS 7      Chemistry Recent Labs  Lab 09/14/20 1715 09/15/20 0251  NA 138 139  K 3.6 3.6  CL 100 100  CO2 29 33*  GLUCOSE 101* 111*  BUN 18 15  CREATININE 1.06 1.13  CALCIUM 9.1 8.8*  PROT 6.8  --   ALBUMIN 3.6  --   AST 32  --   ALT 38  --   ALKPHOS 74  --   BILITOT 1.5*  --   GFRNONAA >60 >60  ANIONGAP 9 6     Hematology Recent Labs  Lab 09/14/20 1715 09/15/20 0251  WBC 7.7 6.2  RBC 4.86 4.24  HGB 15.4 13.6  HCT 47.1 41.3  MCV 96.9 97.4  MCH 31.7 32.1  MCHC 32.7 32.9  RDW 16.3* 16.4*  PLT 209 173    BNP Recent Labs  Lab 09/14/20 1715  BNP 302.1*     DDimer No results for input(s): DDIMER in the last 168 hours.   Radiology    DG Chest [place] 1  View  Result Date: 09/14/2020 CLINICAL DATA:  Shortness of breath EXAM: PORTABLE CHEST 1 VIEW COMPARISON:  None. FINDINGS: Cardiomegaly. Possible small pleural effusions. Hazy atelectasis at the bases. No overt edema. No pneumothorax. IMPRESSION: Cardiomegaly with possible small pleural effusions and hazy atelectasis at the bases. Electronically Signed   By: Donavan Foil M.D.   On: 09/14/2020 17:30    Cardiac Studies   Echocardiogram pending  Patient Profile     80 y.o. male atrial fibrillation rapid ventricular response, acute diastolic heart failure, obesity, chronic lower extremity edema  Assessment & Plan    Atrial fibrillation - IV Cardizem currently.  Challenging to rate control. - On Eliquis starting 09/14/2020 - CHADSVASc 4, age greater than 79, hypertension, heart failure -BNP 302, LDL 72 We will check TSH and free T4 - I am going to start amiodarone 400 mg p.o. 3 times daily with anticipation of cardioversion.  With  cardiomegaly, worried about depressed ejection fraction.  Acute heart failure - Checking echocardiogram.  Could be systolic heart failure with low EF given atrial fibrillation.  Cardiomegaly noted. - I would suspect that EF will be poor and I will transition over to metoprolol and add amiodarone with anticipation of cardioversion, TEE. I will increase Lasix to 40 mg IV twice daily - Currently on irbesartan 75 mg a day as well as HCTZ 12.5 mg a day.  Morbid obesity - Continue to encourage weight loss  Okay to eat today.  For questions or updates, please contact [place] Please consult [email or web address] for contact info under        Signed, Candee Furbish, MD  09/15/2020, 9:28 AM

## 2020-09-15 NOTE — Discharge Instructions (Addendum)
WEIGH DAILY, every am, wearing the same amount of clothing Record weights, and bring it to office appointments Contact Dr Margaretann Loveless for weight gain of 3 lbs in a day or 5 lbs in a week Limit sodium to 500 mg per meal, total 2000 mg per day Limit all liquids to 1.5-2 liters/quarts per day   Atrial Fibrillation  Atrial fibrillation is a type of heartbeat that is irregular or fast. If you have this condition, your heart beats without any order. This makes it hard for your heart to pump blood in a normal way. Atrial fibrillation may come and go, or it may become a long-lasting problem. If this condition is not treated, it can put you at higher risk for stroke, heart failure, and other heart problems. What are the causes? This condition may be caused by diseases that damage the heart. They include:  High blood pressure.  Heart failure.  Heart valve disease.  Heart surgery. Other causes include:  Diabetes.  Thyroid disease.  Being overweight.  Kidney disease. Sometimes the cause is not known. What increases the risk? You are more likely to develop this condition if:  You are older.  You smoke.  You exercise often and very hard.  You have a family history of this condition.  You are a man.  You use drugs.  You drink a lot of alcohol.  You have lung conditions, such as emphysema, pneumonia, or COPD.  You have sleep apnea. What are the signs or symptoms? Common symptoms of this condition include:  A feeling that your heart is beating very fast.  Chest pain or discomfort.  Feeling short of breath.  Suddenly feeling light-headed or weak.  Getting tired easily during activity.  Fainting.  Sweating. In some cases, there are no symptoms. How is this treated? Treatment for this condition depends on underlying conditions and how you feel when you have atrial fibrillation. They include:  Medicines to: ? Prevent blood clots. ? Treat heart rate or heart rhythm  problems.  Using devices, such as a pacemaker, to correct heart rhythm problems.  Doing surgery to remove the part of the heart that sends bad signals.  Closing an area where clots can form in the heart (left atrial appendage). In some cases, your doctor will treat other underlying conditions. Follow these instructions at home: Medicines  Take over-the-counter and prescription medicines only as told by your doctor.  Do not take any new medicines without first talking to your doctor.  If you are taking blood thinners: ? Talk with your doctor before you take any medicines that have aspirin or NSAIDs, such as ibuprofen, in them. ? Take your medicine exactly as told by your doctor. Take it at the same time each day. ? Avoid activities that could hurt or bruise you. Follow instructions about how to prevent falls. ? Wear a bracelet that says you are taking blood thinners. Or, carry a card that lists what medicines you take. Lifestyle  Do not use any products that have nicotine or tobacco in them. These include cigarettes, e-cigarettes, and chewing tobacco. If you need help quitting, ask your doctor.  Eat heart-healthy foods. Talk with your doctor about the right eating plan for you.  Exercise regularly as told by your doctor.  Do not drink alcohol.  Lose weight if you are overweight.  Do not use drugs, including cannabis.      General instructions  If you have a condition that causes breathing to stop for a short  period of time (apnea), treat it as told by your doctor.  Keep a healthy weight. Do not use diet pills unless your doctor says they are safe for you. Diet pills may make heart problems worse.  Keep all follow-up visits as told by your doctor. This is important. Contact a doctor if:  You notice a change in the speed, rhythm, or strength of your heartbeat.  You are taking a blood-thinning medicine and you get more bruising.  You get tired more easily when you move or  exercise.  You have a sudden change in weight. Get help right away if:  You have pain in your chest or your belly (abdomen).  You have trouble breathing.  You have side effects of blood thinners, such as blood in your vomit, poop (stool), or pee (urine), or bleeding that cannot stop.  You have any signs of a stroke. "BE FAST" is an easy way to remember the main warning signs: ? B - Balance. Signs are dizziness, sudden trouble walking, or loss of balance. ? E - Eyes. Signs are trouble seeing or a change in how you see. ? F - Face. Signs are sudden weakness or loss of feeling in the face, or the face or eyelid drooping on one side. ? A - Arms. Signs are weakness or loss of feeling in an arm. This happens suddenly and usually on one side of the body. ? S - Speech. Signs are sudden trouble speaking, slurred speech, or trouble understanding what people say. ? T - Time. Time to call emergency services. Write down what time symptoms started.  You have other signs of a stroke, such as: ? A sudden, very bad headache with no known cause. ? Feeling like you may vomit (nausea). ? Vomiting. ? A seizure. These symptoms may be an emergency. Do not wait to see if the symptoms will go away. Get medical help right away. Call your local emergency services (911 in the [place] Do not drive yourself to the hospital.   Summary  Atrial fibrillation is a type of heartbeat that is irregular or fast.  You are at higher risk of this condition if you smoke, are older, have diabetes, or are overweight.  Follow your doctor's instructions about medicines, diet, exercise, and follow-up visits.  Get help right away if you have signs or symptoms of a stroke.  Get help right away if you cannot catch your breath, or you have chest pain or discomfort. This information is not intended to replace advice given to you by your health care provider. Make sure you discuss any questions you have with your health care  provider. Document Revised: 11/12/2018 Document Reviewed: 11/12/2018 Elsevier Patient Education  Terry on my medicine - ELIQUIS (apixaban)  Why was Eliquis prescribed for you? Eliquis was prescribed for you to reduce the risk of a blood clot forming that can cause a stroke if you have a medical condition called atrial fibrillation (a type of irregular heartbeat).  What do You need to know about Eliquis ? Take your Eliquis TWICE DAILY - one tablet in the morning and one tablet in the evening with or without food. If you have difficulty swallowing the tablet whole please discuss with your pharmacist how to take the medication safely.  Take Eliquis exactly as prescribed by your doctor and DO NOT stop taking Eliquis without talking to the doctor who prescribed the medication.  Stopping may increase your risk of developing a stroke.  Refill  your prescription before you run out.  After discharge, you should have regular check-up appointments with your healthcare provider that is prescribing your Eliquis.  In the future your dose may need to be changed if your kidney function or weight changes by a significant amount or as you get older.  What do you do if you miss a dose? If you miss a dose, take it as soon as you remember on the same day and resume taking twice daily.  Do not take more than one dose of ELIQUIS at the same time to make up a missed dose.  Important Safety Information A possible side effect of Eliquis is bleeding. You should call your healthcare provider right away if you experience any of the following: ? Bleeding from an injury or your nose that does not stop. ? Unusual colored urine (red or dark brown) or unusual colored stools (red or black). ? Unusual bruising for unknown reasons. ? A serious fall or if you hit your head (even if there is no bleeding).  Some medicines may interact with Eliquis and might increase your risk of bleeding or  clotting while on Eliquis. To help avoid this, consult your healthcare provider or pharmacist prior to using any new prescription or non-prescription medications, including herbals, vitamins, non-steroidal anti-inflammatory drugs (NSAIDs) and supplements.  This website has more information on Eliquis (apixaban): http://www.eliquis.com/eliquis/home

## 2020-09-15 NOTE — Plan of Care (Signed)
  Problem: Education: Goal: Knowledge of General Education information will improve Description Including pain rating scale, medication(s)/side effects and non-pharmacologic comfort measures Outcome: Progressing   Problem: Health Behavior/Discharge Planning: Goal: Ability to manage health-related needs will improve Outcome: Progressing   

## 2020-09-15 NOTE — TOC Benefit Eligibility Note (Signed)
Patient Teacher, English as a foreign language completed.    The patient is currently admitted and upon discharge could be taking Eliquis 5 mg.  The current 30 day co-pay is, $527.00. Due to a $480.00 deductible.  After meeting deductible, co-pay will be $47.00  The patient is insured through [place], [place] Patient Advocate Specialist [place] Team Direct Number: [phone]  Fax: [phone]

## 2020-09-15 NOTE — Progress Notes (Signed)
  Echocardiogram 2D Echocardiogram with definity has been performed.  Darlina Sicilian M 09/15/2020, 11:52 AM

## 2020-09-16 ENCOUNTER — Other Ambulatory Visit (HOSPITAL_COMMUNITY): Payer: Self-pay

## 2020-09-16 DIAGNOSIS — I4891 Unspecified atrial fibrillation: Secondary | ICD-10-CM | POA: Diagnosis not present

## 2020-09-16 MED ORDER — METOPROLOL TARTRATE 50 MG PO TABS
50.0000 mg | ORAL_TABLET | Freq: Two times a day (BID) | ORAL | Status: DC
  Administered 2020-09-16 – 2020-09-19 (×7): 50 mg via ORAL
  Filled 2020-09-16 (×7): qty 1

## 2020-09-16 NOTE — TOC Benefit Eligibility Note (Deleted)
Transition of Care [facility]) Benefit Eligibility Note    Patient Details  Name: Ronald [name] MRN: [id number] Date of Birth: Jan 27, 1941   Medication/Dose: [name]  5 MG BID  Covered?: Yes  Tier: 3 Drug  Prescription Coverage Preferred Pharmacy: [facility]   and  [place] with Person/Company/Phone Number:: JUSTIN  @ [employer] RX #  [phone]  Co-Pay: $50.00  Prior Approval: No          Memory Argue Phone Number: 09/16/2020, 10:42 AM

## 2020-09-16 NOTE — Care Management (Signed)
09-16-20 Benefits check submitted for Eliquis. Case Manager will follow for cost. Graves-Bigelow, Ocie Cornfield, RN,BSN Case Manager

## 2020-09-16 NOTE — Progress Notes (Signed)
Heart Failure Stewardship Pharmacist Progress Note   PCP: Nicolette Bang, DO PCP-Cardiologist: Berniece Salines, DO    HPI:  80 yo M with PMH of HTN and obesity. He presented to outpatient cardiology on 09/14/20 complaining of shortness of breath, DOE, weight gain, orthopnea, tachycardia, and LE edema. He was found to be in afib RVR and was sent to the ED for admission. CXR on 4/13 showed cardiomegaly with possible small pleural effusions and hazy atelectasis. An ECHO was done on 09/15/20 and LVEF was 30-35%. Plan for TEE/DCCV on Monday.   Current HF Medications: Furosemide 40 mg IV BID Metoprolol 50 mg BID Irbesartan 75 mg daily  Prior to admission HF Medications: Furosemide 20 mg daily Valsartan 80 mg daily  Pertinent Lab Values: . Serum creatinine 1.13, BUN 15, Potassium 3.6, Sodium 139, BNP 302.1  Vital Signs: . Weight: 283 lbs (admission weight: 288 lbs) . Blood pressure: 90/70s  . Heart rate: 100s   Medication Assistance / Insurance Benefits Check: Does the patient have prescription insurance?  Yes Type of insurance plan: BCBS Medicare + supplement  Does the patient qualify for medication assistance through manufacturers or grants?   Pending . Eligible grants and/or patient assistance programs: Lisabeth Register . Medication assistance applications in progress: none  . Medication assistance applications approved: none Approved medication assistance renewals will be completed by: pending  Outpatient Pharmacy:  [facility] Is the patient willing to use [place] at discharge? Yes Is the patient willing to transition their outpatient pharmacy to utilize a [facility] outpatient pharmacy?   Pending    Assessment: 1. 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 NYHA class III symptoms. - Continue furosemide 40 mg IV BID (did not receive dose this AM due to low BP) - On metoprolol 50  mg BID - may need to reduce dose to allow for more BP room to receive IV diuretics  - Irbesartan not given this AM either due to low BP. Consider discontinuing for now. May eventually be able to transition to [facility] for HF GDMT if BP allows. - Consider spironolactone (once BP improves) for further optimization with reduced EF - Consider starting Farxiga 10 mg daily prior to discharge   Plan: 1) Medication changes recommended at this time: - Continue current regimen - will notify MD about missed doses of IV lasix with low BP, may need to lower metoprolol dose  2) Patient assistance: Delene Loll, Jardiance/Farxiga copays all initially $527 because he has not met his deductible yet. Once this is met, copays will be $47 each. - Appreciate assistance from Willette Alma, CPhT  3)  Education  - Patient has been educated on current HF medications and potential additions to HF medication regimen - Patient verbalizes understanding that over the next few months, these medication doses may change and more medications may be added to optimize HF regimen - Patient has been educated on basic disease state pathophysiology and goals of therapy  Kerby Nora, PharmD, BCPS Heart Failure Stewardship Pharmacist Phone [phone]

## 2020-09-16 NOTE — TOC Benefit Eligibility Note (Signed)
Patient Teacher, English as a foreign language completed.    The patient is currently admitted and upon discharge could be taking Entresto  24mg /26mg .  The current 30 day co-pay is, $527.00. Due to a $480.00 deductible.  After meeting deductible, co-pay will be $47.00  The patient is currently admitted and upon discharge could be taking Farxiga 10mg .  The current 30 day co-pay is, $527.00. Due to a $480.00 deductible.  After meeting deductible, co-pay will be $47.00  The patient is currently admitted and upon discharge could be taking Jardiance 10mg .  The current 30 day co-pay is, $527.00. Due to a $480.00 deductible.  After meeting deductible, co-pay will be $47.00  The patient is insured through [place], [place] Patient Advocate Specialist [place] Team Direct Number: [phone]  Fax: [phone]

## 2020-09-16 NOTE — TOC Benefit Eligibility Note (Signed)
Transition of Care [facility]) Benefit Eligibility Note    Patient Details  Name: Ronald [name] MRN: [id number] Date of Birth: 03/23/1941   Medication/Dose: [name]  5 MG BID  Covered?: Yes  Tier: 3 Drug  Prescription Coverage [facility]  and  [place] with Person/Company/Phone Number:: ALI   @ [facility] RX #  [phone] OPT- MEMBER  Co-Pay: $42.00  Prior Approval: No  Deductible: Unmet (OUT-OF-POCKET:UNMET)  Additional Notes: THIS  IS THE  CORRECT ANSWER    Memory Argue Phone Number: 09/16/2020, 11:15 AM

## 2020-09-16 NOTE — Progress Notes (Signed)
Progress Note  Patient Name: Ronald [name] Date of Encounter: 09/16/2020  [facility] Cardiologist: Berniece Salines, DO   Subjective   He is done some reading on the Internet.  He said he is feeling more short of breath and thought it was because of the medications.  I explained to him that his pulm function is severely reduced and he is clearly remaining fluid overloaded.  Needs more IV Lasix.  Had lengthy discussion.  Inpatient Medications    Scheduled Meds: . amiodarone  400 mg Oral TID  . apixaban  5 mg Oral BID  . furosemide  40 mg Intravenous BID  . irbesartan  75 mg Oral Daily  . loratadine  10 mg Oral Daily  . metoprolol tartrate  50 mg Oral BID   Continuous Infusions:  PRN Meds: acetaminophen, ondansetron (ZOFRAN) IV   Vital Signs    Vitals:   09/15/20 1811 09/15/20 1824 09/15/20 1955 09/16/20 0409  BP: 116/86 (!) 118/99 109/90 97/70  Pulse: (!) 118 98 93 93  Resp:  18 18 17   Temp:  97.6 F (36.4 C) 97.9 F (36.6 C) 98.7 F (37.1 C)  TempSrc:  Oral Oral Oral  SpO2:  95% 96% 92%  Weight:    128.5 kg  Height:        Intake/Output Summary (Last 24 hours) at 09/16/2020 0941 Last data filed at 09/16/2020 0409 Gross per 24 hour  Intake 562.12 ml  Output 1695 ml  Net -1132.88 ml   Last 3 Weights 09/16/2020 09/15/2020 09/14/2020  Weight (lbs) 283 lb 6.4 oz 286 lb 6.4 oz 288 lb  Weight (kg) 128.549 kg 129.91 kg 130.636 kg      Telemetry    Atrial fibrillation heart rates for the most part below 110- Personally Reviewed  ECG    No new- Personally Reviewed  Physical Exam   GEN: No acute distress.   Neck: No JVD Cardiac: RRR, no murmurs, rubs, or gallops.  Respiratory: Minimally decreased at bases. GI: Soft, nontender, non-distended  MS:  3-4+ lower extremity edema; No deformity. Neuro:  Nonfocal  Psych: Normal affect   Labs    High Sensitivity Troponin:   Recent Labs  Lab 09/14/20 1715  TROPONINIHS 7      Chemistry Recent Labs  Lab  09/14/20 1715 09/15/20 0251  NA 138 139  K 3.6 3.6  CL 100 100  CO2 29 33*  GLUCOSE 101* 111*  BUN 18 15  CREATININE 1.06 1.13  CALCIUM 9.1 8.8*  PROT 6.8  --   ALBUMIN 3.6  --   AST 32  --   ALT 38  --   ALKPHOS 74  --   BILITOT 1.5*  --   GFRNONAA >60 >60  ANIONGAP 9 6     Hematology Recent Labs  Lab 09/14/20 1715 09/15/20 0251  WBC 7.7 6.2  RBC 4.86 4.24  HGB 15.4 13.6  HCT 47.1 41.3  MCV 96.9 97.4  MCH 31.7 32.1  MCHC 32.7 32.9  RDW 16.3* 16.4*  PLT 209 173    BNP Recent Labs  Lab 09/14/20 1715  BNP 302.1*     DDimer No results for input(s): DDIMER in the last 168 hours.   Radiology    DG Chest Port 1 View  Result Date: 09/14/2020 CLINICAL DATA:  Shortness of breath EXAM: PORTABLE CHEST 1 VIEW COMPARISON:  None. FINDINGS: Cardiomegaly. Possible small pleural effusions. Hazy atelectasis at the bases. No overt edema. No pneumothorax. IMPRESSION: Cardiomegaly with possible  small pleural effusions and hazy atelectasis at the bases. Electronically Signed   By: Donavan Foil M.D.   On: 09/14/2020 17:30   ECHOCARDIOGRAM COMPLETE  Result Date: 09/15/2020    ECHOCARDIOGRAM REPORT   Patient Name:   Ronald [name] Date of Exam: 09/15/2020 Medical Rec #:  [id number]       Height:       68.0 in Accession #:    [id number]      Weight:       286.4 lb Date of Birth:  Nov 07, 1940       BSA:          2.381 m Patient Age:    80 years        BP:           133/99 mmHg Patient Gender: M               HR:           97 bpm. Exam Location:  Inpatient Procedure: 2D Echo, Cardiac Doppler, Color Doppler and Intracardiac            Opacification Agent Indications:    Dyspnea R06.00  History:        Patient has no prior history of Echocardiogram examinations.                 Risk Factors:Hypertension.  Sonographer:    Darlina Sicilian RDCS Referring Phys: [phone] Rosalene Wardrop C Gaylon Melchor  Sonographer Comments: Technically difficult study due to poor echo windows. IMPRESSIONS  1. Left ventricular ejection  fraction, by estimation, is 30 to 35%. The left ventricle has moderately decreased function. The left ventricle demonstrates global hypokinesis. The left ventricular internal cavity size was mildly dilated. Left ventricular diastolic parameters are indeterminate.  2. Right ventricular systolic function is mildly reduced. The right ventricular size is normal. Tricuspid regurgitation signal is inadequate for assessing PA pressure.  3. Left atrial size was mild to moderately dilated.  4. Right atrial size was mildly dilated.  5. The mitral valve is normal in structure. No evidence of mitral valve regurgitation. No evidence of mitral stenosis. Moderate mitral annular calcification.  6. The aortic valve is tricuspid. Aortic valve regurgitation is not visualized. No aortic stenosis is present.  7. Aortic dilatation noted. There is mild dilatation of the ascending aorta, measuring 40 mm.  8. The IVC was not well visualized.  9. The patient was in atrial fibrillation. FINDINGS  Left Ventricle: Left ventricular ejection fraction, by estimation, is 30 to 35%. The left ventricle has moderately decreased function. The left ventricle demonstrates global hypokinesis. Definity contrast agent was given IV to delineate the left ventricular endocardial borders. The left ventricular internal cavity size was mildly dilated. There is no left ventricular hypertrophy. Left ventricular diastolic parameters are indeterminate. Right Ventricle: The right ventricular size is normal. No increase in right ventricular wall thickness. Right ventricular systolic function is mildly reduced. Tricuspid regurgitation signal is inadequate for assessing PA pressure. Left Atrium: Left atrial size was mild to moderately dilated. Right Atrium: Right atrial size was mildly dilated. Pericardium: There is no evidence of pericardial effusion. Mitral Valve: The mitral valve is normal in structure. There is mild calcification of the mitral valve leaflet(s).  Moderate mitral annular calcification. No evidence of mitral valve regurgitation. No evidence of mitral valve stenosis. Tricuspid Valve: The tricuspid valve is normal in structure. Tricuspid valve regurgitation is not demonstrated. Aortic Valve: The aortic valve is tricuspid. Aortic valve regurgitation is not visualized. No  aortic stenosis is present. Aortic valve mean gradient measures 2.0 mmHg. Aortic valve peak gradient measures 4.7 mmHg. Aortic valve area, by VTI measures 1.70 cm. Pulmonic Valve: The pulmonic valve was not well visualized. Pulmonic valve regurgitation is not visualized. Aorta: Aortic dilatation noted. There is mild dilatation of the ascending aorta, measuring 40 mm. Venous: The inferior vena cava was not well visualized. IAS/Shunts: No atrial level shunt detected by color flow Doppler.  LEFT VENTRICLE PLAX 2D LVIDd:         6.10 cm LVIDs:         4.50 cm LV PW:         0.90 cm LV IVS:        0.90 cm LVOT diam:     1.80 cm LV SV:         30 LV SV Index:   13 LVOT Area:     2.54 cm  LV Volumes (MOD) LV vol d, MOD A2C: 110.0 ml LV vol d, MOD A4C: 113.0 ml LV vol s, MOD A2C: 91.3 ml LV vol s, MOD A4C: 56.8 ml LV SV MOD A2C:     18.7 ml LV SV MOD A4C:     113.0 ml LV SV MOD BP:      42.6 ml RIGHT VENTRICLE TAPSE (M-mode): 1.6 cm LEFT ATRIUM             Index       RIGHT ATRIUM           Index LA diam:        4.80 cm 2.02 cm/m  RA Area:     23.70 cm LA Vol (A2C):   98.3 ml 41.29 ml/m RA Volume:   73.30 ml  30.79 ml/m LA Vol (A4C):   85.4 ml 35.87 ml/m LA Biplane Vol: 91.9 ml 38.60 ml/m  AORTIC VALVE AV Area (Vmax):    1.69 cm AV Area (Vmean):   1.49 cm AV Area (VTI):     1.70 cm AV Vmax:           108.00 cm/s AV Vmean:          72.300 cm/s AV VTI:            0.175 m AV Peak Grad:      4.7 mmHg AV Mean Grad:      2.0 mmHg LVOT Vmax:         71.80 cm/s LVOT Vmean:        42.400 cm/s LVOT VTI:          0.117 m LVOT/AV VTI ratio: 0.67  AORTA Ao Root diam: 3.80 cm  SHUNTS Systemic VTI:  0.12 m  Systemic Diam: 1.80 cm Loralie Champagne MD Electronically signed by Loralie Champagne MD Signature Date/Time: 09/15/2020/5:25:24 PM    Final     Cardiac Studies   Echocardiogram 09/15/2020:  1. Left ventricular ejection fraction, by estimation, is 30 to 35%. The  left ventricle has moderately decreased function. The left ventricle  demonstrates global hypokinesis. The left ventricular internal cavity size  was mildly dilated. Left ventricular  diastolic parameters are indeterminate.  2. Right ventricular systolic function is mildly reduced. The right  ventricular size is normal. Tricuspid regurgitation signal is inadequate  for assessing PA pressure.  3. Left atrial size was mild to moderately dilated.  4. Right atrial size was mildly dilated.  5. The mitral valve is normal in structure. No evidence of mitral valve  regurgitation. No evidence of mitral  stenosis. Moderate mitral annular  calcification.  6. The aortic valve is tricuspid. Aortic valve regurgitation is not  visualized. No aortic stenosis is present.  7. Aortic dilatation noted. There is mild dilatation of the ascending  aorta, measuring 40 mm.  8. The IVC was not well visualized.  9. The patient was in atrial fibrillation.   Patient Profile     80 y.o. male here with newly discovered cardiomyopathy, possibly tachycardia mediated, acute systolic heart failure, chronic lower extremity edema, morbid obesity, who presented with weight gain, shortness of breath, discovered atrial fibrillation with rapid ventricular response  Assessment & Plan    Atrial fibrillation with rapid ventricular response - Transitioned him yesterday from IV Cardizem over to metoprolol.  Currently at 50 twice daily. -Started amiodarone 400 mg p.o. 3 times a day in the morning of 4/14.  This is in anticipation of cardioversion. I would like for him to be optimized from a heart failure perspective and undergo TEE cardioversion on Monday.  I have  called Trish and he is on the schedule.  Please make him n.p.o. past midnight on Sunday.  Chronic anticoagulation - On apixaban 5 mg twice a day.  No signs of bleeding. -CHADSVASc 4, age greater than 40, hypertension, CHF  Acute systolic heart failure - Newly discovered EF 35%.  Possibly tachycardia mediated.  He thinks he may have been in atrial fibrillation for months. - Currently on low-dose irbesartan 75 mg a day.  Blood pressure fairly soft currently with amiodarone p.o. loading as well as metoprolol and irbesartan. - I will stop his HCTZ 12.5 mg -Continue with Lasix 40 mg IV twice daily as he is able to tolerate. - Free T4 is minimally elevated 1.29 and TSH is normal at 2.6.  Continue to monitor this while on amiodarone. -LDL 72  Morbid obesity - Continue to encourage weight loss, BMI 43  Dilated ascending aorta - 40 mm on echocardiogram.  We will continue to monitor as outpatient.   Clearly frustrated that he does need to stay over the weekend however by the end of her conversation he understood that it was in his best interests.  For questions or updates, please contact [place] Please consult [email or web address] for contact info under        Signed, Candee Furbish, MD  09/16/2020, 9:41 AM

## 2020-09-16 NOTE — Progress Notes (Signed)
Heart Failure Nurse Navigator Progress Note  Planned DCCV Monday after more diuresing this weekend. Pt has many questions regarding HF, fluid retention and AF.   Plan to interview Monday for HV TOC readiness and schedule an appt.   Education Assessment and Provision:  Detailed education and instructions provided on heart failure disease management including the following:  Signs and symptoms of Heart Failure When to call the physician Importance of daily weights Low sodium diet Fluid restriction Medication management Anticipated future follow-up appointments  Patient education given on each of the above topics.  Patient acknowledges understanding via teach back method and acceptance of all instructions.  Education Materials:  "Living Better With Heart Failure" Booklet, HF zone tool, & Daily Weight Tracker Tool.  Pricilla Holm, RN, BSN Heart Failure Nurse Navigator [phone]

## 2020-09-17 DIAGNOSIS — I4891 Unspecified atrial fibrillation: Secondary | ICD-10-CM | POA: Diagnosis not present

## 2020-09-17 DIAGNOSIS — I5021 Acute systolic (congestive) heart failure: Secondary | ICD-10-CM | POA: Diagnosis not present

## 2020-09-17 MED ORDER — POTASSIUM CHLORIDE CRYS ER 20 MEQ PO TBCR
20.0000 meq | EXTENDED_RELEASE_TABLET | Freq: Two times a day (BID) | ORAL | Status: DC
  Administered 2020-09-17 – 2020-09-18 (×3): 20 meq via ORAL
  Filled 2020-09-17 (×3): qty 1

## 2020-09-17 MED ORDER — FUROSEMIDE 10 MG/ML IJ SOLN
80.0000 mg | Freq: Two times a day (BID) | INTRAMUSCULAR | Status: DC
  Administered 2020-09-17 – 2020-09-18 (×3): 80 mg via INTRAVENOUS
  Filled 2020-09-17 (×3): qty 8

## 2020-09-17 MED ORDER — FUROSEMIDE 10 MG/ML IJ SOLN: 80.0000 mg | Freq: Two times a day (BID) | INTRAMUSCULAR | Status: DC

## 2020-09-17 NOTE — Progress Notes (Signed)
Progress Note  Patient Name: Ronald [name] Date of Encounter: 09/17/2020  [facility] Cardiologist: Berniece Salines, DO   Subjective   Feels he is improving, Still some SOB and orthopnea but thinks his weight has dropped at least 8 lbs.   Inpatient Medications    Scheduled Meds: . amiodarone  400 mg Oral TID  . apixaban  5 mg Oral BID  . furosemide  40 mg Intravenous BID  . irbesartan  75 mg Oral Daily  . loratadine  10 mg Oral Daily  . metoprolol tartrate  50 mg Oral BID   Continuous Infusions:  PRN Meds: acetaminophen, ondansetron (ZOFRAN) IV   Vital Signs    Vitals:   09/16/20 2139 09/16/20 2305 09/17/20 0629 09/17/20 0630  BP: 106/80 92/75 105/71 105/71  Pulse: (!) 105 79 98 93  Resp:    18  Temp:    97.7 F (36.5 C)  TempSrc:    Oral  SpO2:  91% 92% 95%  Weight:    127.1 kg  Height:        Intake/Output Summary (Last 24 hours) at 09/17/2020 0818 Last data filed at 09/16/2020 2126 Gross per 24 hour  Intake 360 ml  Output 700 ml  Net -340 ml   Last 3 Weights 09/17/2020 09/16/2020 09/15/2020  Weight (lbs) 280 lb 3.2 oz 283 lb 6.4 oz 286 lb 6.4 oz  Weight (kg) 127.098 kg 128.549 kg 129.91 kg      Telemetry    Afib rates 100-110, RBBB- Personally Reviewed  ECG    No new- Personally Reviewed  Physical Exam   GEN: No acute distress.   Neck: No JVD seen Cardiac: irregular rhythm, normal rate, no murmurs, rubs, or gallops.  Respiratory: Clear to auscultation bilaterally. GI: Soft, nontender, non-distended  MS: 3+ tense bilateral edema to the knee; No deformity. Neuro:  Nonfocal  Psych: Normal affect    Labs    High Sensitivity Troponin:   Recent Labs  Lab 09/14/20 1715  TROPONINIHS 7      Chemistry Recent Labs  Lab 09/14/20 1715 09/15/20 0251  NA 138 139  K 3.6 3.6  CL 100 100  CO2 29 33*  GLUCOSE 101* 111*  BUN 18 15  CREATININE 1.06 1.13  CALCIUM 9.1 8.8*  PROT 6.8  --   ALBUMIN 3.6  --   AST 32  --   ALT 38  --   ALKPHOS  74  --   BILITOT 1.5*  --   GFRNONAA >60 >60  ANIONGAP 9 6     Hematology Recent Labs  Lab 09/14/20 1715 09/15/20 0251  WBC 7.7 6.2  RBC 4.86 4.24  HGB 15.4 13.6  HCT 47.1 41.3  MCV 96.9 97.4  MCH 31.7 32.1  MCHC 32.7 32.9  RDW 16.3* 16.4*  PLT 209 173    BNP Recent Labs  Lab 09/14/20 1715  BNP 302.1*     DDimer No results for input(s): DDIMER in the last 168 hours.   Radiology    ECHOCARDIOGRAM COMPLETE  Result Date: 09/15/2020    ECHOCARDIOGRAM REPORT   Patient Name:   Ronald [name] Date of Exam: 09/15/2020 Medical Rec #:  [id number]       Height:       68.0 in Accession #:    [id number]      Weight:       286.4 lb Date of Birth:  01-02-1941       BSA:  2.381 m Patient Age:    80 years        BP:           133/99 mmHg Patient Gender: M               HR:           97 bpm. Exam Location:  Inpatient Procedure: 2D Echo, Cardiac Doppler, Color Doppler and Intracardiac            Opacification Agent Indications:    Dyspnea R06.00  History:        Patient has no prior history of Echocardiogram examinations.                 Risk Factors:Hypertension.  Sonographer:    Darlina Sicilian RDCS Referring Phys: [phone] MARK C SKAINS  Sonographer Comments: Technically difficult study due to poor echo windows. IMPRESSIONS  1. Left ventricular ejection fraction, by estimation, is 30 to 35%. The left ventricle has moderately decreased function. The left ventricle demonstrates global hypokinesis. The left ventricular internal cavity size was mildly dilated. Left ventricular diastolic parameters are indeterminate.  2. Right ventricular systolic function is mildly reduced. The right ventricular size is normal. Tricuspid regurgitation signal is inadequate for assessing PA pressure.  3. Left atrial size was mild to moderately dilated.  4. Right atrial size was mildly dilated.  5. The mitral valve is normal in structure. No evidence of mitral valve regurgitation. No evidence of mitral stenosis.  Moderate mitral annular calcification.  6. The aortic valve is tricuspid. Aortic valve regurgitation is not visualized. No aortic stenosis is present.  7. Aortic dilatation noted. There is mild dilatation of the ascending aorta, measuring 40 mm.  8. The IVC was not well visualized.  9. The patient was in atrial fibrillation. FINDINGS  Left Ventricle: Left ventricular ejection fraction, by estimation, is 30 to 35%. The left ventricle has moderately decreased function. The left ventricle demonstrates global hypokinesis. Definity contrast agent was given IV to delineate the left ventricular endocardial borders. The left ventricular internal cavity size was mildly dilated. There is no left ventricular hypertrophy. Left ventricular diastolic parameters are indeterminate. Right Ventricle: The right ventricular size is normal. No increase in right ventricular wall thickness. Right ventricular systolic function is mildly reduced. Tricuspid regurgitation signal is inadequate for assessing PA pressure. Left Atrium: Left atrial size was mild to moderately dilated. Right Atrium: Right atrial size was mildly dilated. Pericardium: There is no evidence of pericardial effusion. Mitral Valve: The mitral valve is normal in structure. There is mild calcification of the mitral valve leaflet(s). Moderate mitral annular calcification. No evidence of mitral valve regurgitation. No evidence of mitral valve stenosis. Tricuspid Valve: The tricuspid valve is normal in structure. Tricuspid valve regurgitation is not demonstrated. Aortic Valve: The aortic valve is tricuspid. Aortic valve regurgitation is not visualized. No aortic stenosis is present. Aortic valve mean gradient measures 2.0 mmHg. Aortic valve peak gradient measures 4.7 mmHg. Aortic valve area, by VTI measures 1.70 cm. Pulmonic Valve: The pulmonic valve was not well visualized. Pulmonic valve regurgitation is not visualized. Aorta: Aortic dilatation noted. There is mild  dilatation of the ascending aorta, measuring 40 mm. Venous: The inferior vena cava was not well visualized. IAS/Shunts: No atrial level shunt detected by color flow Doppler.  LEFT VENTRICLE PLAX 2D LVIDd:         6.10 cm LVIDs:         4.50 cm LV PW:  0.90 cm LV IVS:        0.90 cm LVOT diam:     1.80 cm LV SV:         30 LV SV Index:   13 LVOT Area:     2.54 cm  LV Volumes (MOD) LV vol d, MOD A2C: 110.0 ml LV vol d, MOD A4C: 113.0 ml LV vol s, MOD A2C: 91.3 ml LV vol s, MOD A4C: 56.8 ml LV SV MOD A2C:     18.7 ml LV SV MOD A4C:     113.0 ml LV SV MOD BP:      42.6 ml RIGHT VENTRICLE TAPSE (M-mode): 1.6 cm LEFT ATRIUM             Index       RIGHT ATRIUM           Index LA diam:        4.80 cm 2.02 cm/m  RA Area:     23.70 cm LA Vol (A2C):   98.3 ml 41.29 ml/m RA Volume:   73.30 ml  30.79 ml/m LA Vol (A4C):   85.4 ml 35.87 ml/m LA Biplane Vol: 91.9 ml 38.60 ml/m  AORTIC VALVE AV Area (Vmax):    1.69 cm AV Area (Vmean):   1.49 cm AV Area (VTI):     1.70 cm AV Vmax:           108.00 cm/s AV Vmean:          72.300 cm/s AV VTI:            0.175 m AV Peak Grad:      4.7 mmHg AV Mean Grad:      2.0 mmHg LVOT Vmax:         71.80 cm/s LVOT Vmean:        42.400 cm/s LVOT VTI:          0.117 m LVOT/AV VTI ratio: 0.67  AORTA Ao Root diam: 3.80 cm  SHUNTS Systemic VTI:  0.12 m Systemic Diam: 1.80 cm Loralie Champagne MD Electronically signed by Loralie Champagne MD Signature Date/Time: 09/15/2020/5:25:24 PM    Final     Cardiac Studies   Echocardiogram 09/15/2020:  1. Left ventricular ejection fraction, by estimation, is 30 to 35%. The  left ventricle has moderately decreased function. The left ventricle  demonstrates global hypokinesis. The left ventricular internal cavity size  was mildly dilated. Left ventricular  diastolic parameters are indeterminate.  2. Right ventricular systolic function is mildly reduced. The right  ventricular size is normal. Tricuspid regurgitation signal is inadequate  for  assessing PA pressure.  3. Left atrial size was mild to moderately dilated.  4. Right atrial size was mildly dilated.  5. The mitral valve is normal in structure. No evidence of mitral valve  regurgitation. No evidence of mitral stenosis. Moderate mitral annular  calcification.  6. The aortic valve is tricuspid. Aortic valve regurgitation is not  visualized. No aortic stenosis is present.  7. Aortic dilatation noted. There is mild dilatation of the ascending  aorta, measuring 40 mm.  8. The IVC was not well visualized.  9. The patient was in atrial fibrillation.   Patient Profile     80 y.o. male here with newly discovered cardiomyopathy, possibly tachycardia mediated, acute systolic heart failure, chronic lower extremity edema, morbid obesity, who presented with weight gain, shortness of breath, discovered atrial fibrillation with rapid ventricular response  Assessment & Plan    Atrial fibrillation with  rapid ventricular response - metoprolol tartrate 50mg  twice daily. -Started amiodarone 400 mg p.o. 3 times a day in the morning of 4/14.  This is in anticipation of cardioversion. - TEE cardioversion on Monday. Please make him n.p.o. past midnight on Sunday. If not optimized from HF standpoint by Sunday afternoon, we discussed postponing until Tuesday. He is agreeable if needed.  Chronic anticoagulation - On apixaban 5 mg twice a day.  No signs of bleeding. -CHADSVASc 4, age greater than 46, hypertension, CHF  Acute systolic heart failure - Newly discovered EF 35%.  Possibly tachycardia mediated.  He thinks he may have been in atrial fibrillation for months. Perhaps started after falling on the ice and having bad back spasms. - Currently on low-dose irbesartan 75 mg a day.  Blood pressure mildly hypotensive  -d/c HCTZ 12.5 mg -Continue with Lasix 40 mg IV twice daily as he is able to tolerate. - Free T4 is minimally elevated 1.29 and TSH is normal at 2.6.  Continue to monitor  this while on amiodarone. -LDL 72 Cr: 1.06>>1.13 UOP yesterday: 1.5 L Weight: 287 >> 279. Dry weight is 260lb Net negative for admission: -3.3L Diuretic plan: will stop ARB for now and will increase dose of Lasix to 80 mg IV BID.   Morbid obesity - Continue to encourage weight loss, BMI 43  Dilated ascending aorta - 40 mm on echocardiogram.  We will continue to monitor as outpatient.   Clearly frustrated that he does need to stay over the weekend however by the end of her conversation he understood that it was in his best interests.  For questions or updates, please contact [place] Please consult [email or web address] for contact info under        Signed, Elouise Munroe, MD  09/17/2020, 8:18 AM

## 2020-09-17 NOTE — Plan of Care (Signed)
  Problem: Clinical Measurements: Goal: Ability to maintain clinical measurements within normal limits will improve Outcome: Progressing Goal: Will remain free from infection Outcome: Progressing Goal: Respiratory complications will improve Outcome: Progressing   

## 2020-09-18 DIAGNOSIS — I5021 Acute systolic (congestive) heart failure: Secondary | ICD-10-CM | POA: Diagnosis not present

## 2020-09-18 DIAGNOSIS — I4891 Unspecified atrial fibrillation: Secondary | ICD-10-CM | POA: Diagnosis not present

## 2020-09-18 LAB — CBC WITH DIFFERENTIAL/PLATELET
Abs Immature Granulocytes: 0.01 10*3/uL (ref 0.00–0.07)
Basophils Absolute: 0 10*3/uL (ref 0.0–0.1)
Basophils Relative: 1 %
Eosinophils Absolute: 0.2 10*3/uL (ref 0.0–0.5)
Eosinophils Relative: 3 %
HCT: 39.9 % (ref 39.0–52.0)
Hemoglobin: 13.1 g/dL (ref 13.0–17.0)
Immature Granulocytes: 0 %
Lymphocytes Relative: 33 %
Lymphs Abs: 2.2 10*3/uL (ref 0.7–4.0)
MCH: 31.6 pg (ref 26.0–34.0)
MCHC: 32.8 g/dL (ref 30.0–36.0)
MCV: 96.4 fL (ref 80.0–100.0)
Monocytes Absolute: 0.7 10*3/uL (ref 0.1–1.0)
Monocytes Relative: 11 %
Neutro Abs: 3.5 10*3/uL (ref 1.7–7.7)
Neutrophils Relative %: 52 %
Platelets: 153 10*3/uL (ref 150–400)
RBC: 4.14 MIL/uL — ABNORMAL LOW (ref 4.22–5.81)
RDW: 16.1 % — ABNORMAL HIGH (ref 11.5–15.5)
WBC: 6.6 10*3/uL (ref 4.0–10.5)
nRBC: 0 % (ref 0.0–0.2)

## 2020-09-18 LAB — BASIC METABOLIC PANEL
Anion gap: 8 (ref 5–15)
BUN: 21 mg/dL (ref 8–23)
CO2: 34 mmol/L — ABNORMAL HIGH (ref 22–32)
Calcium: 8.6 mg/dL — ABNORMAL LOW (ref 8.9–10.3)
Chloride: 98 mmol/L (ref 98–111)
Creatinine, Ser: 1.23 mg/dL (ref 0.61–1.24)
GFR, Estimated: >60 mL/min (ref >60)
Glucose, Bld: 87 mg/dL (ref 70–99)
Potassium: 3.3 mmol/L — ABNORMAL LOW (ref 3.5–5.1)
Sodium: 140 mmol/L (ref 135–145)

## 2020-09-18 LAB — PROTIME-INR
INR: 1.4 — ABNORMAL HIGH (ref 0.8–1.2)
Prothrombin Time: 16.9 seconds — ABNORMAL HIGH (ref 11.4–15.2)

## 2020-09-18 MED ORDER — POTASSIUM CHLORIDE CRYS ER 20 MEQ PO TBCR
40.0000 meq | EXTENDED_RELEASE_TABLET | Freq: Two times a day (BID) | ORAL | Status: AC
  Administered 2020-09-18: 40 meq via ORAL
  Filled 2020-09-18: qty 2

## 2020-09-18 MED ORDER — POTASSIUM CHLORIDE CRYS ER 20 MEQ PO TBCR
20.0000 meq | EXTENDED_RELEASE_TABLET | Freq: Once | ORAL | Status: AC
  Administered 2020-09-18: 20 meq via ORAL
  Filled 2020-09-18: qty 1

## 2020-09-18 MED ORDER — FUROSEMIDE 80 MG PO TABS
80.0000 mg | ORAL_TABLET | Freq: Every day | ORAL | Status: DC
  Administered 2020-09-19: 80 mg via ORAL
  Filled 2020-09-18: qty 1

## 2020-09-18 MED ORDER — SODIUM CHLORIDE 0.9 % IV SOLN: INTRAVENOUS | Status: DC

## 2020-09-18 NOTE — Progress Notes (Signed)
Progress Note  Patient Name: Ronald [name] Date of Encounter: 09/18/2020  [facility] Cardiologist: Berniece Salines, DO   Subjective   Feeling well and back to his baseline weight. TEE/DCCV tomorrow  Inpatient Medications    Scheduled Meds: . amiodarone  400 mg Oral TID  . apixaban  5 mg Oral BID  . furosemide  80 mg Intravenous BID  . loratadine  10 mg Oral Daily  . metoprolol tartrate  50 mg Oral BID  . potassium chloride  20 mEq Oral BID   Continuous Infusions:  PRN Meds: acetaminophen, ondansetron (ZOFRAN) IV   Vital Signs    Vitals:   09/17/20 1414 09/17/20 1955 09/17/20 2114 09/18/20 0432  BP:  110/74 111/78 100/87  Pulse:  95 (!) 117 95  Resp: 20 18  16   Temp: (!) 97.2 F (36.2 C) 98.6 F (37 C)  97.8 F (36.6 C)  TempSrc: Oral Oral  Oral  SpO2:  99%  99%  Weight:    125 kg  Height:        Intake/Output Summary (Last 24 hours) at 09/18/2020 0909 Last data filed at 09/17/2020 2100 Gross per 24 hour  Intake 960 ml  Output 1350 ml  Net -390 ml   Last 3 Weights 09/18/2020 09/17/2020 09/16/2020  Weight (lbs) 275 lb 9.6 oz 280 lb 3.2 oz 283 lb 6.4 oz  Weight (kg) 125.011 kg 127.098 kg 128.549 kg      Telemetry    afib rates in 90s- Personally Reviewed  ECG    No new- Personally Reviewed  Physical Exam   GEN: No acute distress.   Neck: No JVD Cardiac: iRRR, no murmurs, rubs, or gallops.  Respiratory: Clear to auscultation bilaterally. GI: Soft, nontender, non-distended  MS: 3 + edema; No deformity. Neuro:  Nonfocal  Psych: Normal affect   Labs    High Sensitivity Troponin:   Recent Labs  Lab 09/14/20 1715  TROPONINIHS 7      Chemistry Recent Labs  Lab 09/14/20 1715 09/15/20 0251 09/18/20 0822  NA 138 139 140  K 3.6 3.6 3.3*  CL 100 100 98  CO2 29 33* 34*  GLUCOSE 101* 111* 87  BUN 18 15 21   CREATININE 1.06 1.13 1.23  CALCIUM 9.1 8.8* 8.6*  PROT 6.8  --   --   ALBUMIN 3.6  --   --   AST 32  --   --   ALT 38  --   --    ALKPHOS 74  --   --   BILITOT 1.5*  --   --   GFRNONAA >60 >60 >60  ANIONGAP 9 6 8      Hematology Recent Labs  Lab 09/14/20 1715 09/15/20 0251 09/18/20 0822  WBC 7.7 6.2 6.6  RBC 4.86 4.24 4.14*  HGB 15.4 13.6 13.1  HCT 47.1 41.3 39.9  MCV 96.9 97.4 96.4  MCH 31.7 32.1 31.6  MCHC 32.7 32.9 32.8  RDW 16.3* 16.4* 16.1*  PLT 209 173 153    BNP Recent Labs  Lab 09/14/20 1715  BNP 302.1*     DDimer No results for input(s): DDIMER in the last 168 hours.   Radiology    No results found.  Cardiac Studies   Echocardiogram 09/15/2020:  1. Left ventricular ejection fraction, by estimation, is 30 to 35%. The  left ventricle has moderately decreased function. The left ventricle  demonstrates global hypokinesis. The left ventricular internal cavity size  was mildly dilated. Left ventricular  diastolic  parameters are indeterminate.  2. Right ventricular systolic function is mildly reduced. The right  ventricular size is normal. Tricuspid regurgitation signal is inadequate  for assessing PA pressure.  3. Left atrial size was mild to moderately dilated.  4. Right atrial size was mildly dilated.  5. The mitral valve is normal in structure. No evidence of mitral valve  regurgitation. No evidence of mitral stenosis. Moderate mitral annular  calcification.  6. The aortic valve is tricuspid. Aortic valve regurgitation is not  visualized. No aortic stenosis is present.  7. Aortic dilatation noted. There is mild dilatation of the ascending  aorta, measuring 40 mm.  8. The IVC was not well visualized.  9. The patient was in atrial fibrillation.   Patient Profile     80 y.o. male here with newly discovered cardiomyopathy, possibly tachycardia mediated, acute systolic heart failure, chronic lower extremity edema, morbid obesity, who presented with weight gain, shortness of breath, discovered atrial fibrillation with rapid ventricular response.  Assessment & Plan     Atrial fibrillation with rapid ventricular response - metoprolol tartrate 50mg  twice daily. -Started amiodarone 400 mg p.o. 3 times a day in the morning of 4/14.  This is in anticipation of cardioversion. - TEE cardioversion on Monday - I have personally placed the orders -GA. Will make him n.p.o. past midnight on Sunday. Better optimized, and HF symptoms and exam improving.  After careful review of history and examination, the risks and benefits of transesophageal echocardiogram have been explained including risks of esophageal damage, perforation (1:10,000 risk), bleeding, pharyngeal hematoma as well as other potential complications associated with conscious sedation including aspiration, arrhythmia, respiratory failure and death. Alternatives to treatment were discussed, questions were answered. Patient is willing to proceed. Risks, benefits and alternatives of direct current cardioversion reviewed including potential for post-cardioversion rhythms, especially life-threatening arrhythmias (ventricular tachycardia and fibrillation, profound bradycardia). Major complications may include serious or fatal arrhythmias, myocardial damage, and acute pulmonary edema; minor complications include skin burns and transient hypotension. Benefits include restoration of sinus rhythm. Alternatives to treatment were discussed, questions were answered. Patient is willing to proceed.   Chronic anticoagulation - On apixaban 5 mg twice a day.  No signs of bleeding. -CHADSVASc 4, age greater than 64, hypertension, CHF  Acute systolic heart failure - Newly discovered EF 35%.  Possibly tachycardia mediated.  He thinks he may have been in atrial fibrillation for months. Perhaps started after falling on the ice and having bad back spasms. - Currently on low-dose irbesartan 75 mg a day.  Blood pressure mildly hypotensive  -d/c HCTZ 12.5 mg -Continue with Lasix 40 mg IV twice daily as he is able to tolerate. - Free T4 is  minimally elevated 1.29 and TSH is normal at 2.6.  Continue to monitor this while on amiodarone. -LDL 72 Cr: 1.06>>1.13 >>1.23 UOP yesterday: 1.35 L Weight: 287 >> 279>>275. Dry weight is 260lb, but today patient states 275 lb is his weight prior to falling and hurting his back. May be at "dry" weight today. Net negative for admission: -3.5L Diuretic plan: will transition to furosemide 80 mg daily.  Morbid obesity - Continue to encourage weight loss, BMI 43  Dilated ascending aorta - 40 mm on echocardiogram.  We will continue to monitor as outpatient.   For questions or updates, please contact [place] Please consult [email or web address] for contact info under        Signed, Elouise Munroe, MD  09/18/2020, 9:09 AM

## 2020-09-19 ENCOUNTER — Inpatient Hospital Stay (HOSPITAL_COMMUNITY): Payer: Medicare Other | Admitting: Certified Registered"

## 2020-09-19 ENCOUNTER — Encounter (HOSPITAL_COMMUNITY)
Admission: EM | Disposition: A | Discharge status: Home / Self Care | Payer: Self-pay | Source: Home / Self Care | Attending: Cardiology

## 2020-09-19 ENCOUNTER — Other Ambulatory Visit (HOSPITAL_COMMUNITY): Payer: Self-pay

## 2020-09-19 ENCOUNTER — Encounter (HOSPITAL_COMMUNITY): Payer: Self-pay | Admitting: Cardiovascular Disease

## 2020-09-19 ENCOUNTER — Inpatient Hospital Stay (HOSPITAL_COMMUNITY): Payer: Medicare Other

## 2020-09-19 DIAGNOSIS — I5021 Acute systolic (congestive) heart failure: Secondary | ICD-10-CM | POA: Diagnosis not present

## 2020-09-19 DIAGNOSIS — I4891 Unspecified atrial fibrillation: Secondary | ICD-10-CM | POA: Diagnosis not present

## 2020-09-19 DIAGNOSIS — I340 Nonrheumatic mitral (valve) insufficiency: Secondary | ICD-10-CM

## 2020-09-19 HISTORY — PX: CARDIOVERSION: SHX1299

## 2020-09-19 HISTORY — PX: TEE WITHOUT CARDIOVERSION: SHX5443

## 2020-09-19 LAB — CBC
HCT: 41.9 % (ref 39.0–52.0)
Hemoglobin: 13.8 g/dL (ref 13.0–17.0)
MCH: 31.8 pg (ref 26.0–34.0)
MCHC: 32.9 g/dL (ref 30.0–36.0)
MCV: 96.5 fL (ref 80.0–100.0)
Platelets: 169 10*3/uL (ref 150–400)
RBC: 4.34 MIL/uL (ref 4.22–5.81)
RDW: 16 % — ABNORMAL HIGH (ref 11.5–15.5)
WBC: 6.2 10*3/uL (ref 4.0–10.5)
nRBC: 0 % (ref 0.0–0.2)

## 2020-09-19 LAB — BASIC METABOLIC PANEL
Anion gap: 6 (ref 5–15)
BUN: 22 mg/dL (ref 8–23)
CO2: 34 mmol/L — ABNORMAL HIGH (ref 22–32)
Calcium: 8.7 mg/dL — ABNORMAL LOW (ref 8.9–10.3)
Chloride: 100 mmol/L (ref 98–111)
Creatinine, Ser: 1.37 mg/dL — ABNORMAL HIGH (ref 0.61–1.24)
GFR, Estimated: 53 mL/min — ABNORMAL LOW (ref >60)
Glucose, Bld: 87 mg/dL (ref 70–99)
Potassium: 3.8 mmol/L (ref 3.5–5.1)
Sodium: 140 mmol/L (ref 135–145)

## 2020-09-19 SURGERY — ECHOCARDIOGRAM, TRANSESOPHAGEAL
Anesthesia: Monitor Anesthesia Care

## 2020-09-19 MED ORDER — PHENYLEPHRINE HCL (PRESSORS) 10 MG/ML IV SOLN
INTRAVENOUS | Status: DC | PRN
  Administered 2020-09-19: 40 ug via INTRAVENOUS
  Administered 2020-09-19: 80 ug via INTRAVENOUS
  Administered 2020-09-19 (×2): 40 ug via INTRAVENOUS
  Administered 2020-09-19: 80 ug via INTRAVENOUS
  Administered 2020-09-19: 40 ug via INTRAVENOUS

## 2020-09-19 MED ORDER — APIXABAN 5 MG PO TABS
5.0000 mg | ORAL_TABLET | Freq: Two times a day (BID) | ORAL | 11 refills | Status: DC
  Filled 2020-09-19: qty 60, 30d supply, fill #0

## 2020-09-19 MED ORDER — LIDOCAINE HCL (CARDIAC) PF 50 MG/5ML IV SOSY
PREFILLED_SYRINGE | INTRAVENOUS | Status: DC | PRN
  Administered 2020-09-19: 40 mg via INTRAVENOUS

## 2020-09-19 MED ORDER — EPHEDRINE SULFATE 50 MG/ML IJ SOLN
INTRAMUSCULAR | Status: DC | PRN
  Administered 2020-09-19: 10 mg via INTRAVENOUS

## 2020-09-19 MED ORDER — SODIUM CHLORIDE 0.9 % IV SOLN
INTRAVENOUS | Status: AC | PRN
  Administered 2020-09-19: 500 mL via INTRAVENOUS

## 2020-09-19 MED ORDER — AMIODARONE HCL 200 MG PO TABS
200.0000 mg | ORAL_TABLET | Freq: Three times a day (TID) | ORAL | 6 refills | Status: DC
  Filled 2020-09-19: qty 30, 10d supply, fill #0

## 2020-09-19 MED ORDER — BUTAMBEN-TETRACAINE-BENZOCAINE 2-2-14 % EX AERO
INHALATION_SPRAY | CUTANEOUS | Status: DC | PRN
  Administered 2020-09-19: 2 via TOPICAL

## 2020-09-19 MED ORDER — PROPOFOL 500 MG/50ML IV EMUL
INTRAVENOUS | Status: DC | PRN
  Administered 2020-09-19: 75 ug/kg/min via INTRAVENOUS

## 2020-09-19 MED ORDER — ACETAMINOPHEN 325 MG PO TABS: 650.0000 mg | ORAL_TABLET | ORAL | Status: DC | PRN

## 2020-09-19 MED ORDER — AMIODARONE HCL 200 MG PO TABS
200.0000 mg | ORAL_TABLET | Freq: Every day | ORAL | 6 refills | Status: DC
  Filled 2020-09-19: qty 30, 30d supply, fill #0

## 2020-09-19 MED ORDER — FUROSEMIDE 80 MG PO TABS
80.0000 mg | ORAL_TABLET | Freq: Every day | ORAL | 6 refills | Status: DC
  Filled 2020-09-19: qty 30, 30d supply, fill #0

## 2020-09-19 MED ORDER — METOPROLOL SUCCINATE ER 50 MG PO TB24
50.0000 mg | ORAL_TABLET | Freq: Every day | ORAL | 11 refills | Status: DC
  Filled 2020-09-19: qty 30, 30d supply, fill #0

## 2020-09-19 NOTE — Transfer of Care (Signed)
Immediate Anesthesia Transfer of Care Note  Patient: Ronald [name]  Procedure(s) Performed: TRANSESOPHAGEAL ECHOCARDIOGRAM (TEE) (N/A ) CARDIOVERSION (N/A )  Patient Location: Endoscopy Unit  Anesthesia Type:General  Level of Consciousness: sedated  Airway & Oxygen Therapy: Patient connected to nasal cannula oxygen  Post-op Assessment: Post -op Vital signs reviewed and stable  Post vital signs: stable  Last Vitals:  Vitals Value Taken Time  BP    Temp    Pulse    Resp    SpO2      Last Pain:  Vitals:   09/19/20 1009  TempSrc: Temporal  PainSc: 0-No pain      Patients Stated Pain Goal: 0 (71/27/87 1836)  Complications: No complications documented.

## 2020-09-19 NOTE — Anesthesia Procedure Notes (Signed)
Procedure Name: MAC Date/Time: 09/19/2020 10:56 AM Performed by: Lavell Luster, CRNA Pre-anesthesia Checklist: Patient identified, Emergency Drugs available, Suction available, Patient being monitored and Timeout performed Patient Re-evaluated:Patient Re-evaluated prior to induction Oxygen Delivery Method: Circle system utilized Preoxygenation: Pre-oxygenation with 100% oxygen Induction Type: IV induction Placement Confirmation: breath sounds checked- equal and bilateral and positive ETCO2 Dental Injury: Teeth and Oropharynx as per pre-operative assessment

## 2020-09-19 NOTE — Progress Notes (Signed)
    Transesophageal Echocardiogram Note  Ronald [name] [id number] 1941-05-13  Procedure: Transesophageal Echocardiogram Indications: Atrial fibrillation  Procedure Details Consent: Obtained Time Out: Verified patient identification, verified procedure, site/side was marked, verified correct patient position, special equipment/implants available, Radiology Safety Procedures followed,  medications/allergies/relevent history reviewed, required imaging and test results available.  Performed  Medications:  Pt sedated by anesthesia with lidocaine 40 mg and diprovan 211 mg IV total.  Severe LV dysfunction; moderate biatrial enlargement; spontaneous contrast in LA and LAA but no thrombus noted; severe RV dysfunction; trace AI; mild MR and TR.  Pt subsequently underwent DCCV with 200J to sinus bradycardia with PVCS. Continue apixaban.   Complications: No apparent complications Patient did tolerate procedure well.  Kirk Ruths, MD

## 2020-09-19 NOTE — H&P (View-Only) (Signed)
Progress Note  Patient Name: Ronald [name] Date of Encounter: 09/19/2020  [facility] Cardiologist: Berniece Salines, DO   Subjective  Feels well, continues to notice improvement in his weight and swelling.  Inpatient Medications    Scheduled Meds: . amiodarone  400 mg Oral TID  . apixaban  5 mg Oral BID  . furosemide  80 mg Oral Daily  . loratadine  10 mg Oral Daily  . metoprolol tartrate  50 mg Oral BID   Continuous Infusions: . sodium chloride 20 mL/hr at 09/18/20 2200   PRN Meds: acetaminophen, ondansetron (ZOFRAN) IV   Vital Signs    Vitals:   09/18/20 1401 09/19/20 0613 09/19/20 0824 09/19/20 0828  BP: 104/64 102/75 116/77 116/77  Pulse: 96 93 93 96  Resp:  15 16   Temp:  98.3 F (36.8 C) (!) 97.4 F (36.3 C)   TempSrc:  Oral Oral   SpO2:  99%    Weight:  124.6 kg    Height:        Intake/Output Summary (Last 24 hours) at 09/19/2020 0840 Last data filed at 09/18/2020 1930 Gross per 24 hour  Intake 360 ml  Output 1000 ml  Net -640 ml   Last 3 Weights 09/19/2020 09/18/2020 09/17/2020  Weight (lbs) 274 lb 11.2 oz 275 lb 9.6 oz 280 lb 3.2 oz  Weight (kg) 124.603 kg 125.011 kg 127.098 kg      Telemetry    afib rate around 100 Personally Reviewed  ECG    No new- Personally Reviewed  Physical Exam   GEN: No acute distress.   Neck: No JVD Cardiac: iRRR, no murmurs, rubs, or gallops.  Respiratory: Clear to auscultation bilaterally. GI: Soft, nontender, non-distended  MS: 2-3+ edema; No deformity. Neuro:  Nonfocal  Psych: Normal affect   Labs    High Sensitivity Troponin:   Recent Labs  Lab 09/14/20 1715  TROPONINIHS 7      Chemistry Recent Labs  Lab 09/14/20 1715 09/15/20 0251 09/18/20 0822  NA 138 139 140  K 3.6 3.6 3.3*  CL 100 100 98  CO2 29 33* 34*  GLUCOSE 101* 111* 87  BUN 18 15 21   CREATININE 1.06 1.13 1.23  CALCIUM 9.1 8.8* 8.6*  PROT 6.8  --   --   ALBUMIN 3.6  --   --   AST 32  --   --   ALT 38  --   --   ALKPHOS  74  --   --   BILITOT 1.5*  --   --   GFRNONAA >60 >60 >60  ANIONGAP 9 6 8      Hematology Recent Labs  Lab 09/15/20 0251 09/18/20 0822 09/19/20 0608  WBC 6.2 6.6 6.2  RBC 4.24 4.14* 4.34  HGB 13.6 13.1 13.8  HCT 41.3 39.9 41.9  MCV 97.4 96.4 96.5  MCH 32.1 31.6 31.8  MCHC 32.9 32.8 32.9  RDW 16.4* 16.1* 16.0*  PLT 173 153 169    BNP Recent Labs  Lab 09/14/20 1715  BNP 302.1*     DDimer No results for input(s): DDIMER in the last 168 hours.   Radiology    No results found.  Cardiac Studies   Echocardiogram 09/15/2020:  1. Left ventricular ejection fraction, by estimation, is 30 to 35%. The  left ventricle has moderately decreased function. The left ventricle  demonstrates global hypokinesis. The left ventricular internal cavity size  was mildly dilated. Left ventricular  diastolic parameters are indeterminate.  2.  Right ventricular systolic function is mildly reduced. The right  ventricular size is normal. Tricuspid regurgitation signal is inadequate  for assessing PA pressure.  3. Left atrial size was mild to moderately dilated.  4. Right atrial size was mildly dilated.  5. The mitral valve is normal in structure. No evidence of mitral valve  regurgitation. No evidence of mitral stenosis. Moderate mitral annular  calcification.  6. The aortic valve is tricuspid. Aortic valve regurgitation is not  visualized. No aortic stenosis is present.  7. Aortic dilatation noted. There is mild dilatation of the ascending  aorta, measuring 40 mm.  8. The IVC was not well visualized.  9. The patient was in atrial fibrillation.   Patient Profile     80 y.o. male here with newly discovered cardiomyopathy, possibly tachycardia mediated, acute systolic heart failure, chronic lower extremity edema, morbid obesity, who presented with weight gain, shortness of breath, discovered atrial fibrillation with rapid ventricular response.  Assessment & Plan    Atrial  fibrillation with rapid ventricular response - metoprolol tartrate 50mg  twice daily. -Started amiodarone 400 mg p.o. 3 times a day in the morning of 4/14.  This is in anticipation of cardioversion. - TEE cardioversion on Monday - I have personally placed the orders -GA. NPO. Better optimized, and HF symptoms and exam improving.  Consent noted yesterday.   Chronic anticoagulation - On apixaban 5 mg twice a day.  No signs of bleeding. -CHADSVASc 4, age greater than 48, hypertension, CHF  Acute systolic heart failure - Newly discovered EF 35%.  Possibly tachycardia mediated.  He thinks he may have been in atrial fibrillation for months. Perhaps started after falling on the ice and having bad back spasms. - Currently on low-dose irbesartan 75 mg a day.  Blood pressure mildly hypotensive  - Free T4 is minimally elevated 1.29 and TSH is normal at 2.6.  Continue to monitor this while on amiodarone. -LDL 72 Cr: 1.06>>1.13 >>1.23>> pending today UOP yesterday: 1.00 L Weight: 287 >> 279>>275>>274. Dry weight is 260lb, but today patient states 275 lb is his weight prior to falling and hurting his back. May be at "dry" weight today. Net negative for admission: -4.17L Diuretic plan: furosemide 80 mg daily.  Morbid obesity - Continue to encourage weight loss, BMI 43  Dilated ascending aorta - 40 mm on echocardiogram.  We will continue to monitor as outpatient. Can be checked on TEE Today as well.    For questions or updates, please contact [place] Please consult [email or web address] for contact info under        Signed, Elouise Munroe, MD  09/19/2020, 8:40 AM

## 2020-09-19 NOTE — Anesthesia Procedure Notes (Signed)
Procedure Name: MAC Date/Time: 09/19/2020 10:35 AM Performed by: Lavell Luster, CRNA Pre-anesthesia Checklist: Patient identified, Emergency Drugs available, Suction available, Patient being monitored and Timeout performed Patient Re-evaluated:Patient Re-evaluated prior to induction Oxygen Delivery Method: Nasal cannula Preoxygenation: Pre-oxygenation with 100% oxygen Induction Type: IV induction Placement Confirmation: breath sounds checked- equal and bilateral and positive ETCO2 Dental Injury: Teeth and Oropharynx as per pre-operative assessment

## 2020-09-19 NOTE — Discharge Summary (Signed)
Discharge Summary    Patient ID: Ronald [name] MRN: [id number]; DOB: [birth year]  Admit date: 09/14/2020 Discharge date: 09/19/2020  PCP:  Nicolette Bang, [place]  Cardiologist:  Elouise Munroe, MD  Advanced Practice Provider:  No care team member to display Electrophysiologist:  None [phone]    Discharge Diagnoses    Principal Problem:   Atrial fibrillation with RVR [facility]) Active Problems:   Atrial fibrillation with rapid ventricular response ([place])    Diagnostic Studies/Procedures    TEE: 09/19/2020 1. Left ventricular ejection fraction, by estimation, is 20 to 25%. The  left ventricle has severely decreased function. The left ventricle  demonstrates global hypokinesis. The left ventricular internal cavity size  was mildly dilated.  2. Right ventricular systolic function is severely reduced. The right  ventricular size is mildly enlarged.  3. Left atrial size was moderately dilated. No left atrial/left atrial  appendage thrombus was detected.  4. Right atrial size was moderately dilated.  5. The mitral valve is normal in structure. Mild mitral valve  regurgitation.  6. The aortic valve is tricuspid. Aortic valve regurgitation is trivial.  7. There is mild (Grade II) plaque involving the descending aorta.  Medications: Pt sedated by anesthesia with lidocaine 40 mg and diprovan 211 mg IV total.  Severe LV dysfunction; moderate biatrial enlargement; spontaneous contrast in LA and LAA but no thrombus noted; severe RV dysfunction; trace AI; mild MR and TR.  Pt subsequently underwent DCCV with 200J to sinus bradycardia with PVCS. Continue apixaban.   Complications: No apparent complications Patient did tolerate procedure well.  Kirk Ruths, MD    ECHO: 09/15/2020 1. Left ventricular ejection fraction, by estimation, is 30 to 35%. The  left ventricle has moderately decreased function. The left ventricle   demonstrates global hypokinesis. The left ventricular internal cavity size  was mildly dilated. Left ventricular  diastolic parameters are indeterminate.  2. Right ventricular systolic function is mildly reduced. The right  ventricular size is normal. Tricuspid regurgitation signal is inadequate  for assessing PA pressure.  3. Left atrial size was mild to moderately dilated.  4. Right atrial size was mildly dilated.  5. The mitral valve is normal in structure. No evidence of mitral valve  regurgitation. No evidence of mitral stenosis. Moderate mitral annular  calcification.  6. The aortic valve is tricuspid. Aortic valve regurgitation is not  visualized. No aortic stenosis is present.  7. Aortic dilatation noted. There is mild dilatation of the ascending  aorta, measuring 40 mm.  8. The IVC was not well visualized.  9. The patient was in atrial fibrillation.  _____________   History of Present Illness     Ronald [name] is a 80 y.o. male who presented 4/13 with newly discovered cardiomyopathy, possibly tachycardia mediated, acute systolic heart failure, chronic lower extremity edema, morbid obesity, who presented with weight gain, shortness of breath, discovered atrial fibrillation with rapid ventricular response.   Hospital Course     Consultants: None  Atrial fibrillation with rapid ventricular response - started on metoprolol tartrate 50mg  twice daily. -Started amiodarone 400 mg p.o. 3 times a day in the morning of 4/14.  This is in anticipation of cardioversion. -He will be discharged on amiodarone 200 mg daily after 5.6 g load so far. -  Pt had a TEE cardioversion on Monday, 04/18.  -Successful conversion to sinus rhythm with 1 shock, tolerated the procedure well.  Chronic anticoagulation - Started on apixaban 5 mg twice  a day.  No signs of bleeding. -CHADSVASc 4, age greater than 26, hypertension, CHF -His Naprosyn is discontinued.  He will need to use  Tylenol for pain  Acute systolic heart failure - Newly discovered EF 35%.  Possibly tachycardia mediated.  He thinks he may have been in atrial fibrillation for months. Perhaps started after falling on the ice and having bad back spasms. -  PTA on low-dose valsartan/HCTZ 80/12.5 mg a day. He is mildly hypotensive, so we will hold this preferentially for beta-blocker and Lasix. -He was bradycardic after his cardioversion, so we will decrease the beta-blocker from Lopressor 50 mg twice daily to Toprol-XL 50 mg daily. - Free T4 is minimally elevated 1.29 and TSH is normal at 2.6.  Continue to monitor this while on amiodarone. -LDL 72 Cr: 1.06>>1.13 >>1.23>> 1.37 UOP yesterday: 1.00 L but may have been incomplete Weight: 287 >> 279>>275>>274. He thinks his dry weight is 260lb, but today patient states 275 lb is his weight prior to falling and hurting his back. May be at "dry" weight today. Net negative for admission: -4.347L Diuretic plan: furosemide 80 mg daily.  Morbid obesity - Continue to encourage weight loss, BMI 43  Dilated ascending aorta - 40 mm on echocardiogram.  We will continue to monitor as outpatient.   Post cardioversion, Ronald [name] is feeling well and ambulating without chest pain or shortness of breath.  No further inpatient work-up is indicated and he is considered stable for discharge, to follow-up as an outpatient with the CHF clinic and then with Dr. Margaretann Loveless.  _____________  Discharge Vitals Blood pressure 102/65, pulse (!) 56, temperature (!) 96.4 F (35.8 C), temperature source Oral, resp. rate 16, height 5\' 8"  (1.727 m), weight 124.6 kg, SpO2 96 %.  Filed Weights   09/17/20 0630 09/18/20 0432 09/19/20 0613  Weight: 127.1 kg 125 kg 124.6 kg    Labs & Radiologic Studies    CBC Recent Labs    09/18/20 0822 09/19/20 0608  WBC 6.6 6.2  NEUTROABS 3.5  --   HGB 13.1 13.8  HCT 39.9 41.9  MCV 96.4 96.5  PLT 153 810   Basic Metabolic Panel Recent Labs     09/18/20 0822 09/19/20 0608  NA 140 140  K 3.3* 3.8  CL 98 100  CO2 34* 34*  GLUCOSE 87 87  BUN 21 22  CREATININE 1.23 1.37*  CALCIUM 8.6* 8.7*   Liver Function Tests Lab Results  Component Value Date   CHOL 142 09/15/2020   HDL 58 09/15/2020   LDLCALC 72 09/15/2020   TRIG 61 09/15/2020   CHOLHDL 2.4 09/15/2020   High Sensitivity Troponin:   Recent Labs  Lab 09/14/20 1715  TROPONINIHS 7    BNP    Component Value Date/Time   BNP 302.1 (H) 09/14/2020 1715   Hemoglobin A1C Lab Results  Component Value Date   HGBA1C 5.5 09/15/2020   Fasting Lipid Panel Lab Results  Component Value Date   CHOL 142 09/15/2020   HDL 58 09/15/2020   LDLCALC 72 09/15/2020   TRIG 61 09/15/2020   CHOLHDL 2.4 09/15/2020   Thyroid Function Tests Lab Results  Component Value Date   TSH 2.672 09/15/2020   Free T4  Date Value Ref Range Status  09/15/2020 1.29 (H) 0.61 - 1.12 ng/dL Final    Comment:    (NOTE) Biotin ingestion may interfere with free T4 tests. If the results are inconsistent with the TSH level, previous test results, or the clinical  presentation, then consider biotin interference. If needed, order repeat testing after stopping biotin. Performed at [facility], [place] [address], [place], [place] [address]    _____________  DG Chest Port 1 View  Result Date: 09/14/2020 CLINICAL DATA:  Shortness of breath EXAM: PORTABLE CHEST 1 VIEW COMPARISON:  None. FINDINGS: Cardiomegaly. Possible small pleural effusions. Hazy atelectasis at the bases. No overt edema. No pneumothorax. IMPRESSION: Cardiomegaly with possible small pleural effusions and hazy atelectasis at the bases. Electronically Signed   By: Donavan Foil M.D.   On: 09/14/2020 17:30   ECHOCARDIOGRAM COMPLETE  Result Date: 09/15/2020    ECHOCARDIOGRAM REPORT   Patient Name:   Ronald [name] Date of Exam: 09/15/2020 Medical Rec #:  [id number]       Height:       68.0 in Accession #:    [id number]      Weight:        286.4 lb Date of Birth:  November 23, 1940       BSA:          2.381 m Patient Age:    12 years        BP:           133/99 mmHg Patient Gender: M               HR:           97 bpm. Exam Location:  Inpatient Procedure: 2D Echo, Cardiac Doppler, Color Doppler and Intracardiac            Opacification Agent Indications:    Dyspnea R06.00  History:        Patient has no prior history of Echocardiogram examinations.                 Risk Factors:Hypertension.  Sonographer:    Darlina Sicilian RDCS Referring Phys: [phone] MARK C SKAINS  Sonographer Comments: Technically difficult study due to poor echo windows. IMPRESSIONS  1. Left ventricular ejection fraction, by estimation, is 30 to 35%. The left ventricle has moderately decreased function. The left ventricle demonstrates global hypokinesis. The left ventricular internal cavity size was mildly dilated. Left ventricular diastolic parameters are indeterminate.  2. Right ventricular systolic function is mildly reduced. The right ventricular size is normal. Tricuspid regurgitation signal is inadequate for assessing PA pressure.  3. Left atrial size was mild to moderately dilated.  4. Right atrial size was mildly dilated.  5. The mitral valve is normal in structure. No evidence of mitral valve regurgitation. No evidence of mitral stenosis. Moderate mitral annular calcification.  6. The aortic valve is tricuspid. Aortic valve regurgitation is not visualized. No aortic stenosis is present.  7. Aortic dilatation noted. There is mild dilatation of the ascending aorta, measuring 40 mm.  8. The IVC was not well visualized.  9. The patient was in atrial fibrillation. FINDINGS  Left Ventricle: Left ventricular ejection fraction, by estimation, is 30 to 35%. The left ventricle has moderately decreased function. The left ventricle demonstrates global hypokinesis. Definity contrast agent was given IV to delineate the left ventricular endocardial borders. The left ventricular internal cavity  size was mildly dilated. There is no left ventricular hypertrophy. Left ventricular diastolic parameters are indeterminate. Right Ventricle: The right ventricular size is normal. No increase in right ventricular wall thickness. Right ventricular systolic function is mildly reduced. Tricuspid regurgitation signal is inadequate for assessing PA pressure. Left Atrium: Left atrial size was mild to moderately dilated. Right Atrium: Right atrial size  was mildly dilated. Pericardium: There is no evidence of pericardial effusion. Mitral Valve: The mitral valve is normal in structure. There is mild calcification of the mitral valve leaflet(s). Moderate mitral annular calcification. No evidence of mitral valve regurgitation. No evidence of mitral valve stenosis. Tricuspid Valve: The tricuspid valve is normal in structure. Tricuspid valve regurgitation is not demonstrated. Aortic Valve: The aortic valve is tricuspid. Aortic valve regurgitation is not visualized. No aortic stenosis is present. Aortic valve mean gradient measures 2.0 mmHg. Aortic valve peak gradient measures 4.7 mmHg. Aortic valve area, by VTI measures 1.70 cm. Pulmonic Valve: The pulmonic valve was not well visualized. Pulmonic valve regurgitation is not visualized. Aorta: Aortic dilatation noted. There is mild dilatation of the ascending aorta, measuring 40 mm. Venous: The inferior vena cava was not well visualized. IAS/Shunts: No atrial level shunt detected by color flow Doppler.  LEFT VENTRICLE PLAX 2D LVIDd:         6.10 cm LVIDs:         4.50 cm LV PW:         0.90 cm LV IVS:        0.90 cm LVOT diam:     1.80 cm LV SV:         30 LV SV Index:   13 LVOT Area:     2.54 cm  LV Volumes (MOD) LV vol d, MOD A2C: 110.0 ml LV vol d, MOD A4C: 113.0 ml LV vol s, MOD A2C: 91.3 ml LV vol s, MOD A4C: 56.8 ml LV SV MOD A2C:     18.7 ml LV SV MOD A4C:     113.0 ml LV SV MOD BP:      42.6 ml RIGHT VENTRICLE TAPSE (M-mode): 1.6 cm LEFT ATRIUM             Index        RIGHT ATRIUM           Index LA diam:        4.80 cm 2.02 cm/m  RA Area:     23.70 cm LA Vol (A2C):   98.3 ml 41.29 ml/m RA Volume:   73.30 ml  30.79 ml/m LA Vol (A4C):   85.4 ml 35.87 ml/m LA Biplane Vol: 91.9 ml 38.60 ml/m  AORTIC VALVE AV Area (Vmax):    1.69 cm AV Area (Vmean):   1.49 cm AV Area (VTI):     1.70 cm AV Vmax:           108.00 cm/s AV Vmean:          72.300 cm/s AV VTI:            0.175 m AV Peak Grad:      4.7 mmHg AV Mean Grad:      2.0 mmHg LVOT Vmax:         71.80 cm/s LVOT Vmean:        42.400 cm/s LVOT VTI:          0.117 m LVOT/AV VTI ratio: 0.67  AORTA Ao Root diam: 3.80 cm  SHUNTS Systemic VTI:  0.12 m Systemic Diam: 1.80 cm Loralie Champagne MD Electronically signed by Loralie Champagne MD Signature Date/Time: 09/15/2020/5:25:24 PM    Final    ECHO TEE  Result Date: 09/19/2020    TRANSESOPHOGEAL ECHO REPORT   Patient Name:   Ronald [name] Date of Exam: 09/19/2020 Medical Rec #:  [id number]       Height:  68.0 in Accession #:    [id number]      Weight:       274.7 lb Date of Birth:  1940-09-29       BSA:          2.339 m Patient Age:    44 years        BP:           99/65 mmHg Patient Gender: M               HR:           53 bpm. Exam Location:  Inpatient Procedure: Transesophageal Echo, Cardiac Doppler and Color Doppler Indications:     Atrial fibrillation  History:         Patient has prior history of Echocardiogram examinations, most                  recent 09/15/2020. Cardiomyopathy and CHF, Arrythmias:Atrial                  Fibrillation, Signs/Symptoms:LE edema and Shortness of Breath;                  Risk Factors:Morbid obesity.  Sonographer:     Dustin Flock Referring Phys:  [id number] Elouise Munroe Diagnosing Phys: Kirk Ruths MD PROCEDURE: The transesophogeal probe was passed without difficulty through the esophogus of the patient. Sedation performed by different physician. The patient was monitored while under deep sedation. Anesthestetic sedation was provided  intravenously by Anesthesiology: 211.82mg  of Propofol, 40mg  of Lidocaine. The patient developed no complications during the procedure. IMPRESSIONS  1. Left ventricular ejection fraction, by estimation, is 20 to 25%. The left ventricle has severely decreased function. The left ventricle demonstrates global hypokinesis. The left ventricular internal cavity size was mildly dilated.  2. Right ventricular systolic function is severely reduced. The right ventricular size is mildly enlarged.  3. Left atrial size was moderately dilated. No left atrial/left atrial appendage thrombus was detected.  4. Right atrial size was moderately dilated.  5. The mitral valve is normal in structure. Mild mitral valve regurgitation.  6. The aortic valve is tricuspid. Aortic valve regurgitation is trivial.  7. There is mild (Grade II) plaque involving the descending aorta. FINDINGS  Left Ventricle: Left ventricular ejection fraction, by estimation, is 20 to 25%. The left ventricle has severely decreased function. The left ventricle demonstrates global hypokinesis. The left ventricular internal cavity size was mildly dilated. Right Ventricle: The right ventricular size is mildly enlarged.Right ventricular systolic function is severely reduced. Left Atrium: Left atrial size was moderately dilated. Spontaneous echo contrast was present in the left atrium and left atrial appendage. No left atrial/left atrial appendage thrombus was detected. Right Atrium: Right atrial size was moderately dilated. Pericardium: There is no evidence of pericardial effusion. Mitral Valve: The mitral valve is normal in structure. Mild mitral annular calcification. Mild mitral valve regurgitation. Tricuspid Valve: The tricuspid valve is normal in structure. Tricuspid valve regurgitation is mild. Aortic Valve: The aortic valve is tricuspid. Aortic valve regurgitation is trivial. Pulmonic Valve: The pulmonic valve was normal in structure. Pulmonic valve regurgitation is  trivial. Aorta: The aortic root is normal in size and structure. There is mild (Grade II) plaque involving the descending aorta. IAS/Shunts: No atrial level shunt detected by color flow Doppler. Kirk Ruths MD Electronically signed by Kirk Ruths MD Signature Date/Time: 09/19/2020/11:47:27 AM    Final    Disposition   Pt is being discharged home today  in improved condition.  Follow-up Plans & Appointments     Follow-up Information    [facility]. Go on 09/23/2020.   Specialty: Cardiology Why: AT 10AM. [facility] ([facility]. FREE valet parking at [employer], off [address] all your medications and pill box with you to your ~1 hour appointment. You will see a doctor, HF pharmacist, and nurse at your visit.  Contact information: [address] [id number] mc [address] [place]       Elouise Munroe, MD Follow up.   Specialties: Cardiology, Radiology Contact information: [address] [place] [address] [phone]        Lendon Colonel, NP Follow up on 10/14/2020.   Specialties: Nurse Practitioner, Radiology, Cardiology Why: Please keep appt at Dr Delphina Cahill office w/ Ms Tula Nakayama information: [address] [place]  [address] [phone]              Discharge Instructions    ([place]) Call MD:  Anytime you have any of the following symptoms: 1) 3 pound weight gain in 24 hours or 5 pounds in 1 week 2) shortness of breath, with or without a dry hacking cough 3) swelling in the hands, feet or stomach 4) if you have to sleep on extra pillows at night in order to breathe.   Complete by: As directed    Diet - low sodium heart healthy   Complete by: As directed    Increase activity slowly   Complete by: As directed       Discharge Medications   Allergies as of 09/19/2020   No Known  Allergies     Medication List    STOP taking these medications   naproxen 500 MG tablet Commonly known as: NAPROSYN   valsartan-hydrochlorothiazide 80-12.5 MG tablet Commonly known as: DIOVAN-HCT     TAKE these medications   acetaminophen 325 MG tablet Commonly known as: TYLENOL Take 2 tablets (650 mg total) by mouth every 4 (four) hours as needed for headache or mild pain.   amiodarone 200 MG tablet Commonly known as: PACERONE Take 1 tablet (200 mg total) by mouth daily.   apixaban 5 MG Tabs tablet Commonly known as: ELIQUIS Take 1 tablet (5 mg total) by mouth 2 (two) times daily.   clobetasol cream 0.05 % Commonly known as: TEMOVATE Apply 1 application topically daily as needed (rash).   Combigan 0.2-0.5 % ophthalmic solution Generic drug: brimonidine-timolol Place 1 drop into both eyes every 12 (twelve) hours.   furosemide 80 MG tablet Commonly known as: LASIX Take 1 tablet (80 mg total) by mouth daily. What changed:   medication strength  how much to take   loratadine 10 MG tablet Commonly known as: CLARITIN Take 10 mg by mouth daily as needed for allergies.   metoprolol succinate 50 MG 24 hr tablet Commonly known as: Toprol XL Take 1 tablet (50 mg total) by mouth daily. Take with or immediately following a meal.   multivitamin tablet Take 1 tablet by mouth daily.   Omega 3 1200 MG Caps Take 1,200 mg by mouth daily.   PreserVision AREDS 2 Caps Take 1 capsule by mouth in the morning and at bedtime.   vitamin B-12 1000 MCG tablet Commonly known as: CYANOCOBALAMIN Take 1,000 mcg by mouth daily.          Outstanding Labs/Studies   None  Duration of  Discharge Encounter   Greater than 30 minutes including physician time.  Signed, Rosaria Ferries, PA-C 09/19/2020, 4:59 PM

## 2020-09-19 NOTE — Progress Notes (Signed)
  Echocardiogram Echocardiogram Transesophageal has been performed.  Ronald [name] 09/19/2020, 11:20 AM

## 2020-09-19 NOTE — Anesthesia Preprocedure Evaluation (Signed)
Anesthesia Evaluation  Patient identified by MRN, date of birth, ID band  Reviewed: Allergy & Precautions, NPO status , Patient's Chart, lab work & pertinent test results  Airway Mallampati: II  TM Distance: >3 FB Neck ROM: Full    Dental  (+) Dental Advisory Given   Pulmonary neg pulmonary ROS, former smoker,    Pulmonary exam normal breath sounds clear to auscultation       Cardiovascular hypertension, +CHF  Normal cardiovascular exam Rhythm:Regular Rate:Normal     Neuro/Psych negative neurological ROS     GI/Hepatic negative GI ROS, Neg liver ROS,   Endo/Other  negative endocrine ROS  Renal/GU negative Renal ROS     Musculoskeletal negative musculoskeletal ROS (+)   Abdominal (+) + obese,   Peds  Hematology negative hematology ROS (+)   Anesthesia Other Findings   Reproductive/Obstetrics                             Anesthesia Physical Anesthesia Plan  ASA: III  Anesthesia Plan: MAC   Post-op Pain Management:    Induction: Intravenous  PONV Risk Score and Plan: Propofol infusion, Treatment may vary due to age or medical condition and Ondansetron  Airway Management Planned: Natural Airway  Additional Equipment:   Intra-op Plan:   Post-operative Plan:   Informed Consent: I have reviewed the patients History and Physical, chart, labs and discussed the procedure including the risks, benefits and alternatives for the proposed anesthesia with the patient or authorized representative who has indicated his/her understanding and acceptance.     Dental advisory given  Plan Discussed with: CRNA  Anesthesia Plan Comments:         Anesthesia Quick Evaluation

## 2020-09-19 NOTE — Care Management Important Message (Signed)
Important Message  Patient Details  Name: Ronald [name] MRN: [id number] Date of Birth: April 17, 1941   Medicare Important Message Given:  Yes     Shelda Altes 09/19/2020, 12:18 PM

## 2020-09-19 NOTE — Interval H&P Note (Signed)
History and Physical Interval Note:  09/19/2020 10:11 AM  Ronald [name]  has presented today for surgery, with the diagnosis of Afib.  The various methods of treatment have been discussed with the patient and family. After consideration of risks, benefits and other options for treatment, the patient has consented to  Procedure(s): TRANSESOPHAGEAL ECHOCARDIOGRAM (TEE) (N/A) CARDIOVERSION (N/A) as a surgical intervention.  The patient's history has been reviewed, patient examined, no change in status, stable for surgery.  I have reviewed the patient's chart and labs.  Questions were answered to the patient's satisfaction.     Kirk Ruths

## 2020-09-19 NOTE — Progress Notes (Signed)
Heart Failure Nurse Navigator Progress Note  Pt off unit at this time. In Endoscopy for scheduled DCCV. Will plan to screen for HV TOC readiness later today.  Pricilla Holm, RN, BSN Heart Failure Nurse Navigator [phone]

## 2020-09-19 NOTE — Progress Notes (Signed)
Progress Note  Patient Name: Ronald [name] Date of Encounter: 09/19/2020  [facility] Cardiologist: Berniece Salines, DO   Subjective  Feels well, continues to notice improvement in his weight and swelling.  Inpatient Medications    Scheduled Meds: . amiodarone  400 mg Oral TID  . apixaban  5 mg Oral BID  . furosemide  80 mg Oral Daily  . loratadine  10 mg Oral Daily  . metoprolol tartrate  50 mg Oral BID   Continuous Infusions: . sodium chloride 20 mL/hr at 09/18/20 2200   PRN Meds: acetaminophen, ondansetron (ZOFRAN) IV   Vital Signs    Vitals:   09/18/20 1401 09/19/20 0613 09/19/20 0824 09/19/20 0828  BP: 104/64 102/75 116/77 116/77  Pulse: 96 93 93 96  Resp:  15 16   Temp:  98.3 F (36.8 C) (!) 97.4 F (36.3 C)   TempSrc:  Oral Oral   SpO2:  99%    Weight:  124.6 kg    Height:        Intake/Output Summary (Last 24 hours) at 09/19/2020 0840 Last data filed at 09/18/2020 1930 Gross per 24 hour  Intake 360 ml  Output 1000 ml  Net -640 ml   Last 3 Weights 09/19/2020 09/18/2020 09/17/2020  Weight (lbs) 274 lb 11.2 oz 275 lb 9.6 oz 280 lb 3.2 oz  Weight (kg) 124.603 kg 125.011 kg 127.098 kg      Telemetry    afib rate around 100 Personally Reviewed  ECG    No new- Personally Reviewed  Physical Exam   GEN: No acute distress.   Neck: No JVD Cardiac: iRRR, no murmurs, rubs, or gallops.  Respiratory: Clear to auscultation bilaterally. GI: Soft, nontender, non-distended  MS: 2-3+ edema; No deformity. Neuro:  Nonfocal  Psych: Normal affect   Labs    High Sensitivity Troponin:   Recent Labs  Lab 09/14/20 1715  TROPONINIHS 7      Chemistry Recent Labs  Lab 09/14/20 1715 09/15/20 0251 09/18/20 0822  NA 138 139 140  K 3.6 3.6 3.3*  CL 100 100 98  CO2 29 33* 34*  GLUCOSE 101* 111* 87  BUN 18 15 21   CREATININE 1.06 1.13 1.23  CALCIUM 9.1 8.8* 8.6*  PROT 6.8  --   --   ALBUMIN 3.6  --   --   AST 32  --   --   ALT 38  --   --   ALKPHOS  74  --   --   BILITOT 1.5*  --   --   GFRNONAA >60 >60 >60  ANIONGAP 9 6 8      Hematology Recent Labs  Lab 09/15/20 0251 09/18/20 0822 09/19/20 0608  WBC 6.2 6.6 6.2  RBC 4.24 4.14* 4.34  HGB 13.6 13.1 13.8  HCT 41.3 39.9 41.9  MCV 97.4 96.4 96.5  MCH 32.1 31.6 31.8  MCHC 32.9 32.8 32.9  RDW 16.4* 16.1* 16.0*  PLT 173 153 169    BNP Recent Labs  Lab 09/14/20 1715  BNP 302.1*     DDimer No results for input(s): DDIMER in the last 168 hours.   Radiology    No results found.  Cardiac Studies   Echocardiogram 09/15/2020:  1. Left ventricular ejection fraction, by estimation, is 30 to 35%. The  left ventricle has moderately decreased function. The left ventricle  demonstrates global hypokinesis. The left ventricular internal cavity size  was mildly dilated. Left ventricular  diastolic parameters are indeterminate.  2.  Right ventricular systolic function is mildly reduced. The right  ventricular size is normal. Tricuspid regurgitation signal is inadequate  for assessing PA pressure.  3. Left atrial size was mild to moderately dilated.  4. Right atrial size was mildly dilated.  5. The mitral valve is normal in structure. No evidence of mitral valve  regurgitation. No evidence of mitral stenosis. Moderate mitral annular  calcification.  6. The aortic valve is tricuspid. Aortic valve regurgitation is not  visualized. No aortic stenosis is present.  7. Aortic dilatation noted. There is mild dilatation of the ascending  aorta, measuring 40 mm.  8. The IVC was not well visualized.  9. The patient was in atrial fibrillation.   Patient Profile     80 y.o. male here with newly discovered cardiomyopathy, possibly tachycardia mediated, acute systolic heart failure, chronic lower extremity edema, morbid obesity, who presented with weight gain, shortness of breath, discovered atrial fibrillation with rapid ventricular response.  Assessment & Plan    Atrial  fibrillation with rapid ventricular response - metoprolol tartrate 50mg  twice daily. -Started amiodarone 400 mg p.o. 3 times a day in the morning of 4/14.  This is in anticipation of cardioversion. - TEE cardioversion on Monday - I have personally placed the orders -GA. NPO. Better optimized, and HF symptoms and exam improving.  Consent noted yesterday.   Chronic anticoagulation - On apixaban 5 mg twice a day.  No signs of bleeding. -CHADSVASc 4, age greater than 30, hypertension, CHF  Acute systolic heart failure - Newly discovered EF 35%.  Possibly tachycardia mediated.  He thinks he may have been in atrial fibrillation for months. Perhaps started after falling on the ice and having bad back spasms. - Currently on low-dose irbesartan 75 mg a day.  Blood pressure mildly hypotensive  - Free T4 is minimally elevated 1.29 and TSH is normal at 2.6.  Continue to monitor this while on amiodarone. -LDL 72 Cr: 1.06>>1.13 >>1.23>> pending today UOP yesterday: 1.00 L Weight: 287 >> 279>>275>>274. Dry weight is 260lb, but today patient states 275 lb is his weight prior to falling and hurting his back. May be at "dry" weight today. Net negative for admission: -4.17L Diuretic plan: furosemide 80 mg daily.  Morbid obesity - Continue to encourage weight loss, BMI 43  Dilated ascending aorta - 40 mm on echocardiogram.  We will continue to monitor as outpatient. Can be checked on TEE Today as well.    For questions or updates, please contact [place] Please consult [email or web address] for contact info under        Signed, Elouise Munroe, MD  09/19/2020, 8:40 AM

## 2020-09-19 NOTE — Progress Notes (Signed)
Heart Failure Nurse Navigator Progress Note  PCP: Nicolette Bang, DO PCP-Cardiologist: Katha Cabal., DO Admission Diagnosis: AF RVR Admitted from: home  Presentation:   Ronald [name] presented with SOB on exertion. Stated 40# weight gain, diuresed heavily. Went for DCCV with TEE today. Potential DC today. Pt was happy to schedule appt for close follow up, but was ready for the interview process to be over. Pt stated he does not have a cardiologist.   ECHO/ LVEF: 20-25%, G2DD, RV mildly enlarged, RV severely reduced, LA moderately dilated, RA moderately dilated. Mild MR, Mild TR.  Clinical Course:  Past Medical History:  Diagnosis Date  . Hypertension   . Obesity (BMI 30-39.9) 06/24/2020     Social History   Socioeconomic History  . Marital status: Married    Spouse name: Not on file  . Number of children: Not on file  . Years of education: Not on file  . Highest education level: Not on file  Occupational History  . Not on file  Tobacco Use  . Smoking status: Former Research scientist (life sciences)  . Smokeless tobacco: Never Used  Substance and Sexual Activity  . Alcohol use: Yes  . Drug use: Not on file  . Sexual activity: Not on file  Other Topics Concern  . Not on file  Social History Narrative  . Not on file   Social Determinants of Health   Financial Resource Strain: Not on file  Food Insecurity: Not on file  Transportation Needs: Not on file  Physical Activity: Not on file  Stress: Not on file  Social Connections: Not on file    High Risk Criteria for Readmission and/or Poor Patient Outcomes:  Heart failure hospital admissions (last 6 months): 1   No Show rate: 2%  Difficult social situation: no  Demonstrates medication adherence: yes  Primary Language: English  Literacy level: able to read/write and comprehend.  Education Assessment and Provision:  Detailed education and instructions provided on heart failure disease management including the  following:  Signs and symptoms of Heart Failure When to call the physician Importance of daily weights Low sodium diet Fluid restriction Medication management Anticipated future follow-up appointments  Patient education given on each of the above topics.  Patient acknowledges understanding via teach back method and acceptance of all instructions.  Education Materials:  "Living Better With Heart Failure" Booklet, HF zone tool, & Daily Weight Tracker Tool.  Patient has scale at home: yes  Patient has pill box at home: yes  Barriers of Care:   -new HF dx -no Cardiology  Considerations/Referrals:   Referral made to Heart Failure Pharmacist Stewardship: yes, appreciated Referral made to Heart & Vascular TOC clinic: yes, Friday, April 22 @ 10AM  Items for Follow-up on DC/TOC: -medication optimization -medication compliance/cost -continue HF education/dietary restrictions -cardiologist (new)  Pricilla Holm, RN, BSN Heart Failure Nurse Navigator [phone]

## 2020-09-20 ENCOUNTER — Encounter (HOSPITAL_COMMUNITY): Payer: Self-pay | Admitting: Cardiology

## 2020-09-20 ENCOUNTER — Telehealth: Payer: Self-pay

## 2020-09-20 NOTE — Anesthesia Postprocedure Evaluation (Signed)
Anesthesia Post Note  Patient: Ronald [name]  Procedure(s) Performed: TRANSESOPHAGEAL ECHOCARDIOGRAM (TEE) (N/A ) CARDIOVERSION (N/A )     Patient location during evaluation: Endoscopy Anesthesia Type: MAC Level of consciousness: awake and alert Pain management: pain level controlled Vital Signs Assessment: post-procedure vital signs reviewed and stable Respiratory status: spontaneous breathing Cardiovascular status: stable Anesthetic complications: no   No complications documented.  Last Vitals:  Vitals:   09/19/20 1125 09/19/20 1152  BP: 98/71 102/65  Pulse: (!) 52 (!) 56  Resp: 20 16  Temp:  (!) 35.8 C  SpO2: 96%     Last Pain:  Vitals:   09/19/20 1152  TempSrc: Oral  PainSc: 0-No pain                 Nolon Nations

## 2020-09-20 NOTE — Telephone Encounter (Signed)
Transition Care Management Follow-up Telephone Call  Date of discharge and from where: 09/19/2020, [facility]   How have you been since you were released from the hospital? He stated that he is feeling better  Any questions or concerns? No, not at this time.  Items Reviewed:  Did the pt receive and understand the discharge instructions provided? Yes   Medications obtained and verified? Yes  - he said that he has all of his medications.  No questions about the med regime at this time.  He said that the instructions were explained in detail prior to leaving the hospital   Other? No   Any new allergies since your discharge? No   Do you have support at home? Yes , his wife  [place] and Equipment/Supplies: Were home health services ordered? no If so, what is the name of the agency? n/a  Has the agency set up a time to come to the patient's home? not applicable Were any new equipment or medical supplies ordered?  No What is the name of the medical supply agency? n/a Were you able to get the supplies/equipment? not applicable Do you have any questions related to the use of the equipment or supplies? No  Functional Questionnaire: (I = Independent and D = Dependent) ADLs: independent  Follow up appointments reviewed:   PCP Hospital f/u appt confirmed? No , he did not want to schedule an appointment with PCP at this time. He has cardiology appointment coming up and believes that his medications and medical conditions can be managed by cardiology at this time.  He explained that it has been difficult to schedule an appointment to be seen at [facility] when needed as there are no appointments available.  This CM explained that there are other clinics or a mobile medical unit that he can be referred to if he needs to be seen and is not able to get an appointment at [facility].    [facility] f/u appt confirmed? Yes  - Heart and vascular - 09/23/2020; ENT- 10/04/2020 and heartcare- 10/14/2020     Are transportation arrangements needed? No   If their condition worsens, is the pt aware to call PCP or go to the Emergency Dept.? Yes  Was the patient provided with contact information for the PCP's office or ED? Yes  Was to pt encouraged to call back with questions or concerns? Yes

## 2020-09-22 NOTE — Progress Notes (Signed)
Heart and Vascular Center Transitions of Care Clinic  PCP: Phill Myron Primary Cardiologist: Cherlynn Kaiser  HPI:  Ronald [name] is a 80 y.o.  male  with a PMH significant for systolic heart failure, HTN, morbid obesity,  atrial fibrillation   Prior to a few months ago managed in the outpatient setting, I see a diagnosis of Edema back in 2013.  2020 office visits hypertensive but patient reports good control at home keeps records from 2016 till now.  A recent message to PCP involving a fall states he has been having trouble with fluid retention in his legs for years.    Apparently in January 2022 had a fall and hurt his back.  He noticed his bp machine was getting lower values and reporting his heart rate as irregular and as high as 130.  He discontinued his valsartan HCTZ but was able to restart over time when his bp went back up. All that time he was gaining more weight feeling abdominal distension, shortness of breath and worsening lower extremity edema. He tried to make an appointment with his pcp in March but given his history she advised he seek help at an urgent care or ED.    Seen by urgent care 09/06/2020 was volume overloaded, irregular heartbeat.  Started on lasix 20mg , ambulatory referral to Cardiology.  Seen by Cardiology where he reported 40lb weight gain in 3 months and several months of rapid heart rate.  He was volume overloaded and in afib w/RVR at that visit with a rate of 159.  He was direct admitted to [facility].  Had an ECHO with EF 30-35%, no LVH, no real valvular disease.  He was started on diuretics, amiodarone, metoprolol tartrate, underwent TEE cardioversion which was successful and was discharged.  Weight 287-->274lbs.    Currently feels much better exercise capacity has improved but still some shortness of breath with exertion.  Currently he can walk around 100 yards before getting short of breath.  Denies orthopnea or PND, no chest pains.  Weighing  himself at home keeps a log weight between 273 and 275lbs since discharge last 273.  BP averages around 120/70 at home.     ROS: All systems negative except as listed in HPI, PMH and Problem List.  SH:  Social History   Socioeconomic History  . Marital status: Married    Spouse name: Not on file  . Number of children: Not on file  . Years of education: Not on file  . Highest education level: Not on file  Occupational History  . Not on file  Tobacco Use  . Smoking status: Former Research scientist (life sciences)  . Smokeless tobacco: Never Used  Substance and Sexual Activity  . Alcohol use: Yes  . Drug use: Not on file  . Sexual activity: Not on file  Other Topics Concern  . Not on file  Social History Narrative  . Not on file   Social Determinants of Health   Financial Resource Strain: Not on file  Food Insecurity: Not on file  Transportation Needs: Not on file  Physical Activity: Not on file  Stress: Not on file  Social Connections: Not on file  Intimate Partner Violence: Not on file    FH: No family history on file.  Past Medical History:  Diagnosis Date  . Hypertension   . Obesity (BMI 30-39.9) 06/24/2020    Current Outpatient Medications  Medication Sig Dispense Refill  . acetaminophen (TYLENOL) 325 MG tablet Take 2 tablets (650 mg total)  by mouth every 4 (four) hours as needed for headache or mild pain.    Marland Kitchen amiodarone (PACERONE) 200 MG tablet Take 1 tablet (200 mg total) by mouth daily. 30 tablet 6  . apixaban (ELIQUIS) 5 MG TABS tablet Take 1 tablet (5 mg total) by mouth 2 (two) times daily. 60 tablet 11  . brimonidine-timolol (COMBIGAN) 0.2-0.5 % ophthalmic solution Place 1 drop into both eyes every 12 (twelve) hours.    . clobetasol cream (TEMOVATE) 2.95 % Apply 1 application topically daily as needed (rash).    . furosemide (LASIX) 80 MG tablet Take 1 tablet (80 mg total) by mouth daily. 30 tablet 6  . loratadine (CLARITIN) 10 MG tablet Take 10 mg by mouth daily as needed for  allergies.    . metoprolol succinate (TOPROL XL) 50 MG 24 hr tablet Take 1 tablet (50 mg total) by mouth daily. Take with or immediately following a meal. 30 tablet 11  . Multiple Vitamin (MULTIVITAMIN) tablet Take 1 tablet by mouth daily.    . Multiple Vitamins-Minerals (PRESERVISION AREDS 2) CAPS Take 1 capsule by mouth in the morning and at bedtime.    . Omega 3 1200 MG CAPS Take 1,200 mg by mouth daily.    . vitamin B-12 (CYANOCOBALAMIN) 1000 MCG tablet Take 1,000 mcg by mouth daily.     No current facility-administered medications for this encounter.    Vitals:   09/23/20 1025  BP: 124/82  Pulse: (!) 57  SpO2: 98%  Weight: 126.3 kg (278 lb 6 oz)    PHYSICAL EXAM: Cardiac: JVD 10, bradycardic with normal rhythm, clear s1 and s2, no murmurs, rubs or gallops, Bilateral 2+ LE edema with chronic stasis changes Pulmonary: CTAB, not in distress Abdominal: non distended abdomen, soft and nontender Psych: Alert, conversant, in good spirits   ECG   Sinus bradycardia rate 56, RBBB, LAFB, Bifascicular block  ASSESSMENT & PLAN: Chronic Systolic CHF: -11/2128 ECHO with EF 30-35%, no LVH, no real valvular disease -Likely etiology afib with RVR possibly going on for months or possibly longer prior to admission felt to be mostly afib, has had well controlled HTN over the years, he keeps meticulous records -NYHA Class II symptoms, mildly volume up on exam, REDS 42% -Currently on toprol XL 50mg , lasix 80mg , amiodarone 200mg   -not much bp room, given that he is mildly overloaded and I don't think he can tolerate entresto (did not tolerate irbesartan during hospitalization) will start spiro 12.5mg .   -I expect his EF will improve with maintenance of NSR and gdmt.  Could consider ischemic eval if no improvement given bifascicular block on ecg, obesity, HTN  -would benefit from sleep study given obesity and afib  Afib: -newly diagnosed, possibly going on for several months prior to  diagnosis -CHADS2VASC 4, on eliquis -s/p TEE DCCV 4/18 on amiodarone -continue eliquis, amiodarone and metoprolol -would benefit from sleep study, weight loss  HTN: -Well controlled  Obesity: -encouraged continued dietary changes and weight loss   Follow up with general cardiology

## 2020-09-23 ENCOUNTER — Encounter (HOSPITAL_COMMUNITY): Payer: Self-pay

## 2020-09-23 ENCOUNTER — Ambulatory Visit (HOSPITAL_COMMUNITY)
Admit: 2020-09-23 | Discharge: 2020-09-23 | Disposition: A | Discharge status: Home / Self Care | Payer: Medicare Other | Source: Ambulatory Visit | Attending: Internal Medicine | Admitting: Internal Medicine

## 2020-09-23 ENCOUNTER — Other Ambulatory Visit (HOSPITAL_COMMUNITY): Payer: Self-pay

## 2020-09-23 ENCOUNTER — Other Ambulatory Visit: Payer: Self-pay

## 2020-09-23 VITALS — BP 124/82 | HR 57 | Wt 278.4 lb

## 2020-09-23 DIAGNOSIS — Z6830 Body mass index (BMI) 30.0-30.9, adult: Secondary | ICD-10-CM | POA: Insufficient documentation

## 2020-09-23 DIAGNOSIS — I5022 Chronic systolic (congestive) heart failure: Secondary | ICD-10-CM | POA: Diagnosis not present

## 2020-09-23 DIAGNOSIS — Z7901 Long term (current) use of anticoagulants: Secondary | ICD-10-CM | POA: Diagnosis not present

## 2020-09-23 DIAGNOSIS — Z79899 Other long term (current) drug therapy: Secondary | ICD-10-CM | POA: Diagnosis not present

## 2020-09-23 DIAGNOSIS — R0602 Shortness of breath: Secondary | ICD-10-CM | POA: Diagnosis not present

## 2020-09-23 DIAGNOSIS — Z87891 Personal history of nicotine dependence: Secondary | ICD-10-CM | POA: Diagnosis not present

## 2020-09-23 DIAGNOSIS — I452 Bifascicular block: Secondary | ICD-10-CM | POA: Diagnosis not present

## 2020-09-23 DIAGNOSIS — I110 Hypertensive heart disease with heart failure: Secondary | ICD-10-CM | POA: Insufficient documentation

## 2020-09-23 DIAGNOSIS — I480 Paroxysmal atrial fibrillation: Secondary | ICD-10-CM

## 2020-09-23 DIAGNOSIS — I10 Essential (primary) hypertension: Secondary | ICD-10-CM | POA: Diagnosis not present

## 2020-09-23 DIAGNOSIS — I4891 Unspecified atrial fibrillation: Secondary | ICD-10-CM | POA: Insufficient documentation

## 2020-09-23 LAB — BASIC METABOLIC PANEL
Anion gap: 4 — ABNORMAL LOW (ref 5–15)
BUN: 18 mg/dL (ref 8–23)
CO2: 34 mmol/L — ABNORMAL HIGH (ref 22–32)
Calcium: 8.9 mg/dL (ref 8.9–10.3)
Chloride: 100 mmol/L (ref 98–111)
Creatinine, Ser: 1.23 mg/dL (ref 0.61–1.24)
GFR, Estimated: >60 mL/min (ref >60)
Glucose, Bld: 104 mg/dL — ABNORMAL HIGH (ref 70–99)
Potassium: 3.6 mmol/L (ref 3.5–5.1)
Sodium: 138 mmol/L (ref 135–145)

## 2020-09-23 MED ORDER — SPIRONOLACTONE 25 MG PO TABS: 12.5000 mg | ORAL_TABLET | Freq: Every day | ORAL | 0 refills | Status: DC

## 2020-09-23 NOTE — Patient Instructions (Signed)
Start Spironolactone 12.5 mg (1/2 tab) Daily  Labs done today, your results will be available in MyChart, we will contact you for abnormal readings.  Your physician recommends that you return for lab work in: 1 week  Thank you for allowing [place] to provider your heart failure care after your recent hospitalization. Please follow-up with [facility] at [facility] as scheduled on 10/14/20  If you have any questions, issues, or concerns before your next appointment please call our office at [phone], opt. 2 and leave a message for the triage nurse.  Do the following things EVERYDAY: 1) Weigh yourself in the morning before breakfast. Write it down and keep it in a log. 2) Take your medicines as prescribed 3) Eat low salt foods--Limit salt (sodium) to 2000 mg per day.  4) Stay as active as you can everyday 5) Limit all fluids for the day to less than 2 liters  Limit your fluid intake to less than 2 liters of fluid per day. Fluid includes all drinks, coffee, juice, ice chips, soup, jello, and all other liquids.  Restrict your sodium intake to less than 2000mg  per day. This will help prevent your body from holding onto fluid. Read food labels as a lot of canned and packaged foods have a lot of sodium.

## 2020-09-23 NOTE — Progress Notes (Signed)
Heart and Vascular Center Transitions of Care Clinic Heart Failure Pharmacist Encounter  PCP: Nicolette Bang, DO PCP-Cardiologist: Elouise Munroe, MD  HPI:  80 yo M with PMH of HTN and obesity. He presented to outpatient cardiology on 09/14/20 complaining of shortness of breath, DOE, weight gain, orthopnea, tachycardia, and LE edema. He was found to be in afib RVR and was sent to the ED for admission. CXR on 4/13 showed cardiomegaly with possible small pleural effusions and hazy atelectasis. An ECHO was done on 09/15/20 and LVEF was 30-35%. He was slightly hypotensive with IV lasix and high dose metoprolol so ARB was discontinued. S/p TEE/DCCV on 09/19/20. He was then discharged on 09/19/20 with furosemide and metoprolol XL for his heart failure regimen.  Today, Ronald [name] presents to the [facility] for follow up. He is still having some shortness of breath on exertion. He denies having orthopnea/PND, lightheadedness or dizziness. He is chronically edematous (R>L) but he says this has improved since hospital discharge. He has been checking his weight, BP, and HR daily at home - although he states the BP and HR readings are prior to taking his medications for the day (average BP ~120/70, HR 50s). He has been taking all medications as prescribed and uses a pill box at home.    HF Medications: Furosemide 80 mg daily Metoprolol XL 50 mg daily  Has the patient been experiencing any side effects to the medications prescribed?  no  Does the patient have any problems obtaining medications due to transportation or finances?   no  Understanding of regimen: good Understanding of indications: good Potential of compliance: good Patient understands to avoid NSAIDs. Patient understands to avoid decongestants.   Pertinent Lab Values: . Serum creatinine 1.23, BUN 18, Potassium 3.6, Sodium 138  Vital Signs: . Weight: 278 lbs (discharge weight: 274 lbs) . Blood pressure:  124/82 mmHg . Heart rate: 57 bpm . ReDS: 42% (normal 25-35%)  Medication Assistance / Insurance Benefits Check: Does the patient have prescription insurance?  Yes Type of insurance plan: BCBS Medicare  Does the patient qualify for medication assistance through manufacturers or grants?   No, his income prevents him from being eligible for additional assistance  Outpatient Pharmacy:  [facility] Was the [facility] pharmacy used to supply discharge medications? yes  If TOC pharmacy was used, were the refills transferred out to current pharmacy yet? No - will message TOC PharmD to initiate transfers to [facility] mail order  Is the patient willing to transition their outpatient pharmacy to utilize a [facility] outpatient pharmacy with or without mail order?   No  Assessment: 1) Chronic systolic CHF (EF 88-82%), due to presumed tachy-mediated cardiomyopathy. NYHA class II symptoms. Mildly fluid overloaded on exam with ReDS 42%. - Continue furosemide 80 mg daily - Continue metoprolol XL 50 mg daily - Consider restarting low dose ARB if BP allows. May need to cut back on metoprolol dose if HR and BP remains low. - Start spironolactone 12.5 mg daily. Repeat BMET in 1 week.  Plan: 1) Medication changes: - Start spironolactone 12.5 mg daily  2) Patient Assistance: - He has $480 on deductible remaining - Once deductible is met, Entresto, Eliquis, and Jardiance/Farxiga copays would be $47 each. - He does not meet the income requirement for additional patient assistance - Instructed him to start taking his BP/HR at least 1-2 hours after taking his medications  3) Follow up: - Next appointment with lab on 09/29/20 -  Next appointment with [facility] on 10/14/20  Kerby Nora, PharmD, BCPS Heart Failure Transitions of Care Clinic Pharmacist [phone]

## 2020-09-26 ENCOUNTER — Other Ambulatory Visit (HOSPITAL_COMMUNITY): Payer: Self-pay | Admitting: *Deleted

## 2020-09-26 ENCOUNTER — Other Ambulatory Visit: Payer: Self-pay | Admitting: *Deleted

## 2020-09-26 DIAGNOSIS — R600 Localized edema: Secondary | ICD-10-CM

## 2020-09-26 MED ORDER — APIXABAN 5 MG PO TABS: 5.0000 mg | ORAL_TABLET | Freq: Two times a day (BID) | ORAL | 3 refills | Status: DC

## 2020-09-26 MED ORDER — FUROSEMIDE 80 MG PO TABS: 80.0000 mg | ORAL_TABLET | Freq: Every day | ORAL | 1 refills | Status: DC

## 2020-09-26 NOTE — Telephone Encounter (Signed)
Refill

## 2020-09-29 ENCOUNTER — Ambulatory Visit (HOSPITAL_COMMUNITY)
Admission: RE | Admit: 2020-09-29 | Discharge: 2020-09-29 | Disposition: A | Discharge status: Home / Self Care | Payer: Medicare Other | Source: Ambulatory Visit | Attending: Internal Medicine | Admitting: Internal Medicine

## 2020-09-29 ENCOUNTER — Other Ambulatory Visit: Payer: Self-pay

## 2020-09-29 DIAGNOSIS — I5022 Chronic systolic (congestive) heart failure: Secondary | ICD-10-CM | POA: Diagnosis not present

## 2020-09-29 LAB — BASIC METABOLIC PANEL
Anion gap: 9 (ref 5–15)
BUN: 19 mg/dL (ref 8–23)
CO2: 31 mmol/L (ref 22–32)
Calcium: 8.9 mg/dL (ref 8.9–10.3)
Chloride: 98 mmol/L (ref 98–111)
Creatinine, Ser: 1.1 mg/dL (ref 0.61–1.24)
GFR, Estimated: >60 mL/min (ref >60)
Glucose, Bld: 90 mg/dL (ref 70–99)
Potassium: 3.7 mmol/L (ref 3.5–5.1)
Sodium: 138 mmol/L (ref 135–145)

## 2020-10-01 ENCOUNTER — Other Ambulatory Visit: Payer: Self-pay | Admitting: Family

## 2020-10-01 DIAGNOSIS — R600 Localized edema: Secondary | ICD-10-CM

## 2020-10-04 ENCOUNTER — Ambulatory Visit (INDEPENDENT_AMBULATORY_CARE_PROVIDER_SITE_OTHER): Payer: Medicare Other | Admitting: Otolaryngology

## 2020-10-04 ENCOUNTER — Other Ambulatory Visit: Payer: Self-pay

## 2020-10-04 ENCOUNTER — Encounter (INDEPENDENT_AMBULATORY_CARE_PROVIDER_SITE_OTHER): Payer: Self-pay | Admitting: Otolaryngology

## 2020-10-04 VITALS — Temp 97.3°F

## 2020-10-04 DIAGNOSIS — H6123 Impacted cerumen, bilateral: Secondary | ICD-10-CM

## 2020-10-04 DIAGNOSIS — H903 Sensorineural hearing loss, bilateral: Secondary | ICD-10-CM | POA: Diagnosis not present

## 2020-10-04 NOTE — Progress Notes (Signed)
HPI: Ronald [name] is a 80 y.o. male who presents is referred by Durene Fruits, NP for evaluation of ear complaints.  He feels like his ears have been stopped up especially on the left side for 3 months now.  It was getting a little bit better but is stopped up again today.  He does use Q-tips.  He thought he had eustachian tube problems..  Past Medical History:  Diagnosis Date  . Hypertension   . Obesity (BMI 30-39.9) 06/24/2020   Past Surgical History:  Procedure Laterality Date  . CARDIOVERSION N/A 09/19/2020   Procedure: CARDIOVERSION;  Surgeon: Lelon Perla, MD;  Location: [facility] ENDOSCOPY;  Service: Cardiovascular;  Laterality: N/A;  . TEE WITHOUT CARDIOVERSION N/A 09/19/2020   Procedure: TRANSESOPHAGEAL ECHOCARDIOGRAM (TEE);  Surgeon: Lelon Perla, MD;  Location: [facility] ENDOSCOPY;  Service: Cardiovascular;  Laterality: N/A;  . TONSILLECTOMY     Social History   Socioeconomic History  . Marital status: Married    Spouse name: Not on file  . Number of children: Not on file  . Years of education: Not on file  . Highest education level: Not on file  Occupational History  . Not on file  Tobacco Use  . Smoking status: Former Smoker    Packs/day: 0.50    Years: 40.00    Pack years: 20.00  . Smokeless tobacco: Never Used  Substance and Sexual Activity  . Alcohol use: Yes  . Drug use: Not on file  . Sexual activity: Not on file  Other Topics Concern  . Not on file  Social History Narrative  . Not on file   Social Determinants of Health   Financial Resource Strain: Not on file  Food Insecurity: Not on file  Transportation Needs: Not on file  Physical Activity: Not on file  Stress: Not on file  Social Connections: Not on file   No family history on file. No Known Allergies Prior to Admission medications   Medication Sig Start Date End Date Taking? Authorizing Provider  acetaminophen (TYLENOL) 325 MG tablet Take 2 tablets (650 mg total) by mouth every 4 (four) hours  as needed for headache or mild pain. 09/19/20   Barrett, Evelene Croon, PA-C  amiodarone (PACERONE) 200 MG tablet Take 1 tablet (200 mg total) by mouth daily. 09/19/20   Barrett, Evelene Croon, PA-C  apixaban (ELIQUIS) 5 MG TABS tablet Take 1 tablet (5 mg total) by mouth 2 (two) times daily. 09/26/20   Katherine Roan, MD  brimonidine-timolol (COMBIGAN) 0.2-0.5 % ophthalmic solution Place 1 drop into both eyes every 12 (twelve) hours.    [provider]  clobetasol cream (TEMOVATE) 9.60 % Apply 1 application topically daily as needed (rash).    [provider]  furosemide (LASIX) 80 MG tablet Take 1 tablet (80 mg total) by mouth daily. 09/26/20   Tobb, Kardie, DO  loratadine (CLARITIN) 10 MG tablet Take 10 mg by mouth daily as needed for allergies.    [provider]  metoprolol succinate (TOPROL XL) 50 MG 24 hr tablet Take 1 tablet (50 mg total) by mouth daily. Take with or immediately following a meal. 09/19/20   Barrett, Evelene Croon, PA-C  Multiple Vitamin (MULTIVITAMIN) tablet Take 1 tablet by mouth daily.    [provider]  Multiple Vitamins-Minerals (PRESERVISION AREDS 2) CAPS Take 1 capsule by mouth in the morning and at bedtime.    [provider]  Omega 3 1200 MG CAPS Take 1,200 mg by mouth daily.  [provider]  spironolactone (ALDACTONE) 25 MG tablet Take 0.5 tablets (12.5 mg total) by mouth daily. 09/23/20   Katherine Roan, MD  vitamin B-12 (CYANOCOBALAMIN) 1000 MCG tablet Take 1,000 mcg by mouth daily.    [provider]     Positive ROS: Otherwise negative  All other systems have been reviewed and were otherwise negative with the exception of those mentioned in the HPI and as above.  Physical Exam: Constitutional: Alert, well-appearing, no acute distress Ears: External ears without lesions or tenderness.  He has small ear canals especially more medially within the ear canal with wax obstructing both ear canals and both TMs.   The wax was cleaned with hydroperoxide and suction.  After cleaning the wax from his ear canals the TMs were clear there is no middle ear effusion noted.  On tuning fork testing AC was greater than BC bilaterally and Weber was midline.  He had mild to moderate sensorineural hearing loss subjectively with a 1024 tuning fork. Nasal: External nose without lesions. Septum with minimal deformity and mild rhinitis.  Both middle meatus regions were clear.  No signs of infection..  Oral: Lips and gums without lesions. Tongue and palate mucosa without lesions. Posterior oropharynx clear. Neck: No palpable adenopathy or masses Respiratory: Breathing comfortably  Skin: No facial/neck lesions or rash noted.  Cerumen impaction removal  Date/Time: 10/04/2020 4:15 PM Performed by: Rozetta Nunnery, MD Authorized by: Rozetta Nunnery, MD   Consent:    Consent obtained:  Verbal   Consent given by:  Patient   Risks discussed:  Pain and bleeding Procedure details:    Location:  L ear and R ear   Procedure type: suction and forceps   Post-procedure details:    Inspection:  TM intact and canal normal   Hearing quality:  Improved   Patient tolerance of procedure:  Tolerated well, no immediate complications Comments:     TMs are clear bilaterally.    Assessment: Reviewed with him that the blockage that he feels in his ears was secondary to the wax buildup adjacent to the TMs. He also has underlying sensorineural hearing loss in both ears  Plan: Recommended stop using the Q-tips in the ears. Briefly discussed with him concerning sensorineural hearing loss and he might benefit from getting a hearing test and possible hearing aids depending on the degree of hearing loss.   Radene Journey, MD   CC:

## 2020-10-06 ENCOUNTER — Telehealth (INDEPENDENT_AMBULATORY_CARE_PROVIDER_SITE_OTHER): Payer: Self-pay | Admitting: Otolaryngology

## 2020-10-06 NOTE — Telephone Encounter (Signed)
Ronald [name] calls today stating that his left ear still feels "stopped up".  He does not complain of any hearing problems although on exam in the office he had wax buildup and hearing loss noted on screening hearing with tuning forks. Discussed with him that the ear was normal to examination except for wax buildup. Suggested trying Flonase 2 sprays each nostril at night for a week or 2 to see if this will help. If problems persist would recommend obtaining audiologic testing even though he does not complain of hearing problems.

## 2020-10-13 NOTE — Progress Notes (Signed)
Cardiology Office Note   Date:  10/14/2020   ID:  Nikash Mortensen, DOB December 06, 1940, MRN [id number]  PCP:  Nicolette Bang, DO  Cardiologist: Dr.Acharya  CC: [facility] Follow Up   History of Present Illness: Mose Colaizzi is a 80 y.o. male who presents for posthospitalization follow-up after admission on 09/14/2020 for tachycardic mediated cardiomyopathy, acute systolic heart failure, atrial fibrillation with RVR, with chronic lower extremity edema and morbid obesity.  Transesophageal echocardiogram completed on 09/19/2020 revealed an LVEF of 20 to 25%, the left ventricle demonstrated global hypokinesis with mildly dilated left ventricular cavity size.  RV systolic function was severely reduced and RV size with mildly enlarged.  There were no atrial appendage thrombus is noted.  There was grade 2 plaque involving the descending aorta.  He subsequently underwent DCCV with 200 J converting to normal sinus rhythms sinus bradycardia with PVCs  Metoprolol tartrate 50 mg twice daily was instituted along with amiodarone 400 mg 3 times a day prior to cardioversion with discharge dose of amiodarone 200 mg daily, along with apixaban 5 mg twice daily with a CHADSVASC Score of 4.  Naprosyn was discontinued.  Had been on valsartan/HCTZ 80/12.5 mg prior to hospitalization but this was held due to initiation of metoprolol and amiodarone.  Weight on discharge 274 pounds, and will continue on Lasix 80 mg daily.  Since discharge the patient's weight has gone down to 261 pounds, he denies any edema, but does have complaints of generalized fatigue.  He states that he does not feel as well as he thought he should but it is some better than prior.  He has been medically compliant.  He denies any excessive bleeding, hemoptysis, or epistaxis on Eliquis.  He has reduced his dose of amiodarone to 200 mg daily as directed, and remains on metoprolol.  He denies any return of palpitations or significant dyspnea on  exertion.  Past Medical History:  Diagnosis Date  . Hypertension   . Obesity (BMI 30-39.9) 06/24/2020    Past Surgical History:  Procedure Laterality Date  . CARDIOVERSION N/A 09/19/2020   Procedure: CARDIOVERSION;  Surgeon: Lelon Perla, MD;  Location: [facility] ENDOSCOPY;  Service: Cardiovascular;  Laterality: N/A;  . TEE WITHOUT CARDIOVERSION N/A 09/19/2020   Procedure: TRANSESOPHAGEAL ECHOCARDIOGRAM (TEE);  Surgeon: Lelon Perla, MD;  Location: [facility] ENDOSCOPY;  Service: Cardiovascular;  Laterality: N/A;  . TONSILLECTOMY       Current Outpatient Medications  Medication Sig Dispense Refill  . apixaban (ELIQUIS) 5 MG TABS tablet Take 1 tablet (5 mg total) by mouth 2 (two) times daily. 180 tablet 3  . brimonidine-timolol (COMBIGAN) 0.2-0.5 % ophthalmic solution Place 1 drop into both eyes every 12 (twelve) hours.    . clobetasol cream (TEMOVATE) 4.70 % Apply 1 application topically daily as needed (rash).    . furosemide (LASIX) 80 MG tablet Take 1 tablet (80 mg total) by mouth daily. 90 tablet 1  . Multiple Vitamin (MULTIVITAMIN) tablet Take 1 tablet by mouth daily.    . Multiple Vitamins-Minerals (PRESERVISION AREDS 2) CAPS Take 1 capsule by mouth in the morning and at bedtime.    . Omega 3 1200 MG CAPS Take 1,200 mg by mouth daily.    Marland Kitchen spironolactone (ALDACTONE) 25 MG tablet Take 0.5 tablets (12.5 mg total) by mouth daily. 15 tablet 0  . vitamin B-12 (CYANOCOBALAMIN) 1000 MCG tablet Take 1,000 mcg by mouth daily.    Marland Kitchen amiodarone (PACERONE) 200 MG tablet Take 1 tablet (200 mg total)  by mouth daily. 90 tablet 3  . metoprolol succinate (TOPROL XL) 50 MG 24 hr tablet Take 1 tablet (50 mg total) by mouth daily. Take with or immediately following a meal. 90 tablet 3   No current facility-administered medications for this visit.    Allergies:   Patient has no known allergies.    Social History:  The patient  reports that he has quit smoking. He has a 20.00 pack-year smoking history.  He has never used smokeless tobacco. He reports current alcohol use.   Family History:  The patient's family history is not on file.    ROS: All other systems are reviewed and negative. Unless otherwise mentioned in H&P    PHYSICAL EXAM: VS:  BP 110/66   Pulse 61   Ht 5' 9" (1.753 m)   Wt 261 lb 6.4 oz (118.6 kg)   SpO2 97%   BMI 38.60 kg/m  , BMI Body mass index is 38.6 kg/m. GEN: Well nourished, well developed, in no acute distress HEENT: normal Neck: no JVD, carotid bruits, or masses Cardiac: RRR; no murmurs, rubs, or gallops,no edema  Respiratory:  Clear to auscultation bilaterally, normal work of breathing GI: soft, nontender, nondistended, + BS, central obesity is noted MS: no deformity or atrophy Skin: warm and dry, no rash Neuro:  Strength and sensation are intact Psych: euthymic mood, full affect   EKG: Not completed this office visit  Recent Labs: 09/14/2020: ALT 38; B Natriuretic Peptide 302.1 09/15/2020: TSH 2.672 09/19/2020: Hemoglobin 13.8; Platelets 169 09/29/2020: BUN 19; Creatinine, Ser 1.10; Potassium 3.7; Sodium 138    Lipid Panel    Component Value Date/Time   CHOL 142 09/15/2020 0251   CHOL 203 05/21/2019 0000   TRIG 61 09/15/2020 0251   TRIG 113 05/21/2019 0000   HDL 58 09/15/2020 0251   HDL 62 05/21/2019 0000   CHOLHDL 2.4 09/15/2020 0251   VLDL 12 09/15/2020 0251   LDLCALC 72 09/15/2020 0251   LDLCALC 121 05/21/2019 0000      Wt Readings from Last 3 Encounters:  10/14/20 261 lb 6.4 oz (118.6 kg)  09/23/20 278 lb 6 oz (126.3 kg)  09/19/20 274 lb 11.2 oz (124.6 kg)      Other studies Reviewed: TEE: 09/19/2020 1. Left ventricular ejection fraction, by estimation, is 20 to 25%. The  left ventricle has severely decreased function. The left ventricle  demonstrates global hypokinesis. The left ventricular internal cavity size  was mildly dilated.  2. Right ventricular systolic function is severely reduced. The right  ventricular size  is mildly enlarged.  3. Left atrial size was moderately dilated. No left atrial/left atrial  appendage thrombus was detected.  4. Right atrial size was moderately dilated.  5. The mitral valve is normal in structure. Mild mitral valve  regurgitation.  6. The aortic valve is tricuspid. Aortic valve regurgitation is trivial.  7. There is mild (Grade II) plaque involving the descending aorta.  Medications: Pt sedated by anesthesia withlidocaine 40 mg anddiprovan211mg IV total.  Severe LV dysfunction; moderate biatrial enlargement; spontaneous contrast in LA and LAA but no thrombus noted; severe RV dysfunction; trace AI; mild MR and TR.  Pt subsequently underwent DCCV with 200J to sinusbradycardia with PVCS. Continue apixaban.   Complications:No apparent complications Patientdidtolerate procedure well.  Brian Crenshaw, MD   ECHO: 09/15/2020 1. Left ventricular ejection fraction, by estimation, is 30 to 35%. The  left ventricle has moderately decreased function. The left ventricle  demonstrates global hypokinesis. The left   ventricular internal cavity size  was mildly dilated. Left ventricular  diastolic parameters are indeterminate.  2. Right ventricular systolic function is mildly reduced. The right  ventricular size is normal. Tricuspid regurgitation signal is inadequate  for assessing PA pressure.  3. Left atrial size was mild to moderately dilated.  4. Right atrial size was mildly dilated.  5. The mitral valve is normal in structure. No evidence of mitral valve  regurgitation. No evidence of mitral stenosis. Moderate mitral annular  calcification.  6. The aortic valve is tricuspid. Aortic valve regurgitation is not  visualized. No aortic stenosis is present.  7. Aortic dilatation noted. There is mild dilatation of the ascending  aorta, measuring 40 mm.  8. The IVC was not well visualized.  9. The patient was in atrial fibrillation.     ASSESSMENT AND PLAN:  1.  Paroxysmal atrial fibrillation: Status post [facility] cardioversion.  Remains on amiodarone 200 mg daily along with metoprolol and apixaban.  He has some fatigue but is beginning to feel somewhat better.  He would like to feel stronger sooner, but I have not let him know that he will need to allow for his body to heal and to improve over time.  I have given him some samples of Eliquis as this is become expensive for him but he does not qualify for assistance. CHADS VASC Score 4.   2.  Systolic CHF: I do not see any indication of volume overload.  Would like to repeat echocardiogram in 2 months to evaluate LV function now that he is back in normal sinus rhythm.  May need to consider adding Entresto should he have continued reduction in systolic function.  Currently blood pressure is low normal and he is asymptomatic with the exception of fatigue.  Continue current regimen, follow-up c-Met, TSH in 2 weeks.  3.  Hypertension: Blood pressure is low normal.  He denies any dizziness or lightheadedness.  Labs as above.  Current medicines are reviewed at length with the patient today.  I have spent 25 minutes dedicated to the care of this patient on the date of this encounter to include pre-visit review of records, assessment, management and diagnostic testing,with shared decision making.  Labs/ tests ordered today include: BMET, TSH  Phill Myron. West Pugh, ANP, AACC   10/14/2020 1:10 PM    [facility] [place] [address] Office [phone] Fax [phone]  Notice: This dictation was prepared with Dragon dictation along with smaller phrase technology. Any transcriptional errors that result from this process are unintentional and may not be corrected upon review.

## 2020-10-14 ENCOUNTER — Ambulatory Visit (INDEPENDENT_AMBULATORY_CARE_PROVIDER_SITE_OTHER): Payer: Medicare Other | Admitting: Adult Health

## 2020-10-14 ENCOUNTER — Other Ambulatory Visit: Payer: Self-pay

## 2020-10-14 ENCOUNTER — Encounter: Payer: Self-pay | Admitting: Adult Health

## 2020-10-14 VITALS — BP 110/66 | HR 61 | Ht 69.0 in | Wt 261.4 lb

## 2020-10-14 DIAGNOSIS — R0609 Other forms of dyspnea: Secondary | ICD-10-CM

## 2020-10-14 DIAGNOSIS — R0600 Dyspnea, unspecified: Secondary | ICD-10-CM

## 2020-10-14 DIAGNOSIS — I5022 Chronic systolic (congestive) heart failure: Secondary | ICD-10-CM

## 2020-10-14 DIAGNOSIS — Z79899 Other long term (current) drug therapy: Secondary | ICD-10-CM

## 2020-10-14 DIAGNOSIS — I480 Paroxysmal atrial fibrillation: Secondary | ICD-10-CM

## 2020-10-14 DIAGNOSIS — I10 Essential (primary) hypertension: Secondary | ICD-10-CM

## 2020-10-14 MED ORDER — AMIODARONE HCL 200 MG PO TABS: 200.0000 mg | ORAL_TABLET | Freq: Every day | ORAL | 3 refills | Status: DC

## 2020-10-14 MED ORDER — METOPROLOL SUCCINATE ER 50 MG PO TB24: 50.0000 mg | ORAL_TABLET | Freq: Every day | ORAL | 3 refills | Status: DC

## 2020-10-14 NOTE — Patient Instructions (Addendum)
Medication Instructions:  Continue current medications  *If you need a refill on your cardiac medications before your next appointment, please call your pharmacy*   Lab Work: CMP and TSH in 2 weeks  If you have labs (blood work) drawn today and your tests are completely normal, you will receive your results only by: Marland Kitchen MyChart Message (if you have MyChart) OR . A paper copy in the mail If you have any lab test that is abnormal or we need to change your treatment, we will call you to review the results.   Testing/Procedures: None Ordered   Follow-Up: At [facility], you and your health needs are our priority.  As part of our continuing mission to provide you with exceptional heart care, we have created designated Provider Care Teams.  These Care Teams include your primary Cardiologist (physician) and Advanced Practice Providers (APPs -  Physician Assistants and Nurse Practitioners) who all work together to provide you with the care you need, when you need it.  We recommend signing up for the patient portal called "MyChart".  Sign up information is provided on this After Visit Summary.  MyChart is used to connect with patients for Virtual Visits (Telemedicine).  Patients are able to view lab/test results, encounter notes, upcoming appointments, etc.  Non-urgent messages can be sent to your provider as well.   To learn more about what you can do with MyChart, go to [email or web address].    Your next appointment:   3 month(s)  The format for your next appointment:   In Person  Provider:   You may see Elouise Munroe, MD or one of the following Advanced Practice Providers on your designated Care Team:    Rosaria Ferries, PA-C  Jory Sims, DNP, ANP

## 2020-10-15 ENCOUNTER — Other Ambulatory Visit (HOSPITAL_COMMUNITY): Payer: Self-pay | Admitting: Internal Medicine

## 2020-10-17 MED ORDER — SPIRONOLACTONE 25 MG PO TABS: 12.5000 mg | ORAL_TABLET | Freq: Every day | ORAL | 3 refills | Status: DC

## 2020-11-08 DIAGNOSIS — Z79899 Other long term (current) drug therapy: Secondary | ICD-10-CM | POA: Diagnosis not present

## 2020-11-08 LAB — COMPREHENSIVE METABOLIC PANEL
ALT: 17 IU/L (ref 0–44)
AST: 20 IU/L (ref 0–40)
Albumin/Globulin Ratio: 1.4 (ref 1.2–2.2)
Albumin: 3.9 g/dL (ref 3.7–4.7)
Alkaline Phosphatase: 85 IU/L (ref 44–121)
BUN/Creatinine Ratio: 14 (ref 10–24)
BUN: 16 mg/dL (ref 8–27)
Bilirubin Total: 0.8 mg/dL (ref 0.0–1.2)
CO2: 27 mmol/L (ref 20–29)
Calcium: 9.3 mg/dL (ref 8.6–10.2)
Chloride: 99 mmol/L (ref 96–106)
Creatinine, Ser: 1.18 mg/dL (ref 0.76–1.27)
Globulin, Total: 2.7 g/dL (ref 1.5–4.5)
Glucose: 94 mg/dL (ref 65–99)
Potassium: 4.4 mmol/L (ref 3.5–5.2)
Sodium: 140 mmol/L (ref 134–144)
Total Protein: 6.6 g/dL (ref 6.0–8.5)
eGFR: 63 mL/min/{1.73_m2} (ref >59)

## 2020-11-08 LAB — TSH: TSH: 2.19 u[IU]/mL (ref 0.450–4.500)

## 2020-11-18 ENCOUNTER — Encounter: Payer: Self-pay | Admitting: Family

## 2020-12-27 DIAGNOSIS — H0102B Squamous blepharitis left eye, upper and lower eyelids: Secondary | ICD-10-CM | POA: Diagnosis not present

## 2020-12-27 DIAGNOSIS — H0102A Squamous blepharitis right eye, upper and lower eyelids: Secondary | ICD-10-CM | POA: Diagnosis not present

## 2020-12-27 DIAGNOSIS — H40053 Ocular hypertension, bilateral: Secondary | ICD-10-CM | POA: Diagnosis not present

## 2020-12-27 DIAGNOSIS — H353134 Nonexudative age-related macular degeneration, bilateral, advanced atrophic with subfoveal involvement: Secondary | ICD-10-CM | POA: Diagnosis not present

## 2020-12-27 DIAGNOSIS — H1045 Other chronic allergic conjunctivitis: Secondary | ICD-10-CM | POA: Diagnosis not present

## 2020-12-27 DIAGNOSIS — H04123 Dry eye syndrome of bilateral lacrimal glands: Secondary | ICD-10-CM | POA: Diagnosis not present

## 2020-12-27 DIAGNOSIS — Z961 Presence of intraocular lens: Secondary | ICD-10-CM | POA: Diagnosis not present

## 2021-01-19 ENCOUNTER — Other Ambulatory Visit: Payer: Self-pay | Admitting: Cardiology

## 2021-01-19 DIAGNOSIS — R600 Localized edema: Secondary | ICD-10-CM

## 2021-02-20 ENCOUNTER — Other Ambulatory Visit: Payer: Self-pay

## 2021-02-20 ENCOUNTER — Encounter: Payer: Self-pay | Admitting: Internal Medicine

## 2021-02-20 ENCOUNTER — Ambulatory Visit (INDEPENDENT_AMBULATORY_CARE_PROVIDER_SITE_OTHER): Payer: Medicare Other | Admitting: Internal Medicine

## 2021-02-20 VITALS — BP 142/76 | HR 53 | Ht 71.0 in | Wt 272.8 lb

## 2021-02-20 DIAGNOSIS — I5022 Chronic systolic (congestive) heart failure: Secondary | ICD-10-CM | POA: Diagnosis not present

## 2021-02-20 DIAGNOSIS — D6869 Other thrombophilia: Secondary | ICD-10-CM | POA: Diagnosis not present

## 2021-02-20 DIAGNOSIS — I480 Paroxysmal atrial fibrillation: Secondary | ICD-10-CM

## 2021-02-20 DIAGNOSIS — I10 Essential (primary) hypertension: Secondary | ICD-10-CM | POA: Diagnosis not present

## 2021-02-20 NOTE — Patient Instructions (Signed)
Medication Instructions:  No Changes In Medications at this time.  *If you need a refill on your cardiac medications before your next appointment, please call your pharmacy*  Testing/Procedures: Your physician has requested that you have an echocardiogram. Echocardiography is a painless test that uses sound waves to create images of your heart. It provides your doctor with information about the size and shape of your heart and how well your heart's chambers and valves are working. You may receive an ultrasound enhancing agent through an IV if needed to better visualize your heart during the echo.This procedure takes approximately one hour. There are no restrictions for this procedure. This will take place at the [address], [address].   Follow-Up: At [facility], you and your health needs are our priority.  As part of our continuing mission to provide you with exceptional heart care, we have created designated Provider Care Teams.  These Care Teams include your primary Cardiologist (physician) and Advanced Practice Providers (APPs -  Physician Assistants and Nurse Practitioners) who all work together to provide you with the care you need, when you need it.  Your next appointment:   October 19th at 9:20AM  The format for your next appointment:   In Person  Provider:   Cherlynn Kaiser, MD

## 2021-02-20 NOTE — Progress Notes (Signed)
Cardiology Office Note:    Date:  02/20/2021   ID:  Shirlyn Goltz, DOB Mar 19, 1941, MRN [id number]  PCP:  Nicolette Bang, MD  Cardiologist:  Elouise Munroe, MD  Electrophysiologist:  None   Referring MD: Caryl Never*   Chief Complaint/Reason for Referral: Follow-up heart failure and atrial fibrillation  History of Present Illness:    Ronald [name] is a 80 y.o. male with a history of atrial fibrillation leading to a presumed tachycardia mediated cardiomyopathy presenting with systolic heart failure, also with a history of hypertension and morbid obesity.  He presents for follow-up in cardiology clinic.  He feels this whole episode of atrial fibrillation began with a fall where he hurt his back.  He began to notice irregular fast heart rates on his blood pressure cuff and began to have heart failure symptoms with abdominal distention, shortness of breath, and worsening lower extremity edema.  Given heart failure symptoms, his PCP urged him to go to the emergency department where he was seen September 06, 2020 for decompensated heart failure and atrial fibrillation with rapid ventricular response.  He was diuresed and cardioversion successfully performed.  He has since been seen by heart failure impact clinic shortly after hospital dismissal and by Jory Sims, NP in follow-up.  Most recent echocardiogram 09/2020 with reduced ejection fraction 30 to 35%.  Repeat echo intended for prior to my visit today but not yet performed.  He is fortunately maintaining sinus rhythm and has continued on amiodarone and apixaban.  Overall feels well and notes blood pressures at home of 120/60-70.  He is mostly limited by orthopedic knee pain, and continues to have some mild fatigue.  Blood pressure is somewhat elevated in the office today but again he notes these are usually normal at home.  Has some mild bilateral leg swelling.  The patient denies chest pain, chest pressure, dyspnea  at rest or with exertion, palpitations, PND, orthopnea. Denies cough, fever, chills. Denies nausea, vomiting. Denies syncope or presyncope. Denies dizziness or lightheadedness.  Past Medical History:  Diagnosis Date   Hypertension    Obesity (BMI 30-39.9) 06/24/2020    Past Surgical History:  Procedure Laterality Date   CARDIOVERSION N/A 09/19/2020   Procedure: CARDIOVERSION;  Surgeon: Lelon Perla, MD;  Location: [facility] ENDOSCOPY;  Service: Cardiovascular;  Laterality: N/A;   TEE WITHOUT CARDIOVERSION N/A 09/19/2020   Procedure: TRANSESOPHAGEAL ECHOCARDIOGRAM (TEE);  Surgeon: Lelon Perla, MD;  Location: [facility] ENDOSCOPY;  Service: Cardiovascular;  Laterality: N/A;   TONSILLECTOMY      Current Medications: Current Meds  Medication Sig   amiodarone (PACERONE) 200 MG tablet Take 1 tablet (200 mg total) by mouth daily.   apixaban (ELIQUIS) 5 MG TABS tablet Take 1 tablet (5 mg total) by mouth 2 (two) times daily.   brimonidine-timolol (COMBIGAN) 0.2-0.5 % ophthalmic solution Place 1 drop into both eyes every 12 (twelve) hours.   clobetasol cream (TEMOVATE) 8.31 % Apply 1 application topically daily as needed (rash).   furosemide (LASIX) 80 MG tablet TAKE 1 TABLET DAILY   metoprolol succinate (TOPROL XL) 50 MG 24 hr tablet Take 1 tablet (50 mg total) by mouth daily. Take with or immediately following a meal.   Multiple Vitamin (MULTIVITAMIN) tablet Take 1 tablet by mouth daily.   Multiple Vitamins-Minerals (PRESERVISION AREDS 2) CAPS Take 1 capsule by mouth in the morning and at bedtime.   Omega 3 1200 MG CAPS Take 1,200 mg by mouth daily.   spironolactone (ALDACTONE) 25 MG  tablet Take 0.5 tablets (12.5 mg total) by mouth daily.   vitamin B-12 (CYANOCOBALAMIN) 1000 MCG tablet Take 1,000 mcg by mouth daily.     Allergies:   Patient has no known allergies.   Social History   Tobacco Use   Smoking status: Former    Packs/day: 0.50    Years: 40.00    Pack years: 20.00    Types:  Cigarettes   Smokeless tobacco: Never  Substance Use Topics   Alcohol use: Yes     Family History: The patient's family history is not on file.  ROS:   Please see the history of present illness.    All other systems reviewed and are negative.  EKGs/Labs/Other Studies Reviewed:    The following studies were reviewed today:  EKG:  Sinus bradycardia 53, LAD, iRBBB.   Imaging studies that I have independently reviewed today: n/a  Recent Labs: 09/14/2020: B Natriuretic Peptide 302.1 09/19/2020: Hemoglobin 13.8; Platelets 169 11/08/2020: ALT 17; BUN 16; Creatinine, Ser 1.18; Potassium 4.4; Sodium 140; TSH 2.190  Recent Lipid Panel    Component Value Date/Time   CHOL 142 09/15/2020 0251   CHOL 203 05/21/2019 0000   TRIG 61 09/15/2020 0251   TRIG 113 05/21/2019 0000   HDL 58 09/15/2020 0251   HDL 62 05/21/2019 0000   CHOLHDL 2.4 09/15/2020 0251   VLDL 12 09/15/2020 0251   LDLCALC 72 09/15/2020 0251   LDLCALC 121 05/21/2019 0000    Physical Exam:    VS:  BP (!) 142/76   Pulse (!) 53   Ht 5\' 11"  (1.803 m)   Wt 272 lb 12.8 oz (123.7 kg)   SpO2 97%   BMI 38.05 kg/m     Wt Readings from Last 5 Encounters:  02/20/21 272 lb 12.8 oz (123.7 kg)  10/14/20 261 lb 6.4 oz (118.6 kg)  09/23/20 278 lb 6 oz (126.3 kg)  09/19/20 274 lb 11.2 oz (124.6 kg)  09/14/20 298 lb (135.2 kg)    Constitutional: No acute distress Eyes: sclera non-icteric, normal conjunctiva and lids ENMT: normal dentition, moist mucous membranes Cardiovascular: regular rhythm, normal rate, no murmurs. S1 and S2 normal. Radial pulses normal bilaterally. No jugular venous distention.  Respiratory: clear to auscultation bilaterally GI : normal bowel sounds, soft and nontender. No distention.   MSK: extremities warm, well perfused. No edema.  NEURO: grossly nonfocal exam, moves all extremities. PSYCH: alert and oriented x 3, normal mood and affect.   ASSESSMENT:    1. Chronic systolic congestive heart failure  (HCC)   2. Paroxysmal atrial fibrillation ([place])   3. Secondary hypercoagulable state ([place])   4. Essential hypertension   5. Morbid obesity (HCC)    PLAN:    Chronic systolic congestive heart failure (HCC) - Plan: EKG 12-Lead, ECHOCARDIOGRAM COMPLETE -Needs repeat echocardiogram to follow-up LV dysfunction, to determine whether further work-up needed particularly ischemic work-up if LV dysfunction persists after maintaining sinus rhythm.  We will plan for an echo and close follow-up. Heart Failure Therapy ACE-I/ARB/ARNI: Held in the setting of low normal blood pressures and suspicion that reduced LVEF was related to A. fib RVR.  We will consider using if EF has not recovered particularly now that blood pressure seems to have improved.  Per medical record did not tolerate irbesartan during hospitalization and may not tolerate Entresto, we will review. BB: Metoprolol succinate 50 mg daily MRA: Spironolactone 12.5 mg daily SGLT2I: None currently Diuretic plan: Continues on Lasix 80 mg daily NYHA class II  heart failure symptoms at this time.  Paroxysmal atrial fibrillation (HCC) Secondary hypercoagulable state ([place]) -Continues on amiodarone 200 mg daily with maintenance of sinus rhythm, continue for now.  We may be able to stop this therapy if LVEF has recovered and he continues to maintain sinus rhythm.  He feels his atrial fibrillation was situational due to a traumatic fall. -Continue Eliquis 5 mg twice daily. -Continue metoprolol succinate 50 mg daily.  Essential hypertension -Blood pressure is currently stable and well-controlled on Lasix 80 mg daily, metoprolol succinate 50 mg daily, and spironolactone 12.5 mg daily.  Continue.  Morbid obesity (HCC)-discussed increasing activity level which is limited by orthopedic knee issues, now that he is not significantly limited from heart failure symptoms, he will continue to address this from a mobility standpoint and speak to his primary care  physician further.  Total time of encounter: 30 minutes total time of encounter, including 25 minutes spent in face-to-face patient care on the date of this encounter. This time includes coordination of care and counseling regarding above mentioned problem list. Remainder of non-face-to-face time involved reviewing chart documents/testing relevant to the patient encounter and documentation in the medical record. I have independently reviewed documentation from referring provider.   Cherlynn Kaiser, MD, [place]   Shared Decision Making/Informed Consent:       Medication Adjustments/Labs and Tests Ordered: Current medicines are reviewed at length with the patient today.  Concerns regarding medicines are outlined above.   Orders Placed This Encounter  Procedures   EKG 12-Lead   ECHOCARDIOGRAM COMPLETE    No orders of the defined types were placed in this encounter.   Patient Instructions  Medication Instructions:  No Changes In Medications at this time.  *If you need a refill on your cardiac medications before your next appointment, please call your pharmacy*  Testing/Procedures: Your physician has requested that you have an echocardiogram. Echocardiography is a painless test that uses sound waves to create images of your heart. It provides your doctor with information about the size and shape of your heart and how well your heart's chambers and valves are working. You may receive an ultrasound enhancing agent through an IV if needed to better visualize your heart during the echo.This procedure takes approximately one hour. There are no restrictions for this procedure. This will take place at the [address].   Follow-Up: At [facility], you and your health needs are our priority.  As part of our continuing mission to provide you with exceptional heart care, we have created designated Provider Care Teams.  These Care Teams include your  primary Cardiologist (physician) and Advanced Practice Providers (APPs -  Physician Assistants and Nurse Practitioners) who all work together to provide you with the care you need, when you need it.  Your next appointment:   October 19th at 9:20AM  The format for your next appointment:   In Person  Provider:   Cherlynn Kaiser, MD

## 2021-02-22 ENCOUNTER — Encounter: Payer: Self-pay | Admitting: Family

## 2021-03-01 DIAGNOSIS — Z23 Encounter for immunization: Secondary | ICD-10-CM | POA: Diagnosis not present

## 2021-03-10 ENCOUNTER — Ambulatory Visit (HOSPITAL_COMMUNITY): Payer: Medicare Other

## 2021-03-10 ENCOUNTER — Other Ambulatory Visit: Payer: Self-pay

## 2021-03-21 ENCOUNTER — Ambulatory Visit (HOSPITAL_COMMUNITY): Payer: Medicare Other | Attending: Cardiovascular Disease

## 2021-03-21 ENCOUNTER — Other Ambulatory Visit: Payer: Self-pay

## 2021-03-21 ENCOUNTER — Ambulatory Visit (INDEPENDENT_AMBULATORY_CARE_PROVIDER_SITE_OTHER): Payer: Medicare Other | Admitting: Family Medicine

## 2021-03-21 ENCOUNTER — Encounter: Payer: Self-pay | Admitting: Family Medicine

## 2021-03-21 VITALS — BP 142/82 | HR 67 | Temp 97.8°F | Resp 16 | Ht 69.0 in | Wt 267.0 lb

## 2021-03-21 DIAGNOSIS — M79604 Pain in right leg: Secondary | ICD-10-CM | POA: Diagnosis not present

## 2021-03-21 DIAGNOSIS — Z6828 Body mass index (BMI) 28.0-28.9, adult: Secondary | ICD-10-CM | POA: Diagnosis not present

## 2021-03-21 DIAGNOSIS — I5022 Chronic systolic (congestive) heart failure: Secondary | ICD-10-CM | POA: Diagnosis not present

## 2021-03-21 DIAGNOSIS — I10 Essential (primary) hypertension: Secondary | ICD-10-CM

## 2021-03-21 DIAGNOSIS — I4891 Unspecified atrial fibrillation: Secondary | ICD-10-CM | POA: Diagnosis not present

## 2021-03-21 LAB — ECHOCARDIOGRAM COMPLETE
Area-P 1/2: 3.05 cm2
Height: 81 in
S' Lateral: 3.7 cm
Weight: 4272 oz

## 2021-03-21 MED ORDER — PERFLUTREN LIPID MICROSPHERE
3.0000 mL | INTRAVENOUS | Status: AC | PRN
  Administered 2021-03-21: 3 mL via INTRAVENOUS

## 2021-03-21 MED ORDER — TRIAMCINOLONE ACETONIDE 40 MG/ML IJ SUSP
40.0000 mg | Freq: Once | INTRAMUSCULAR | Status: AC
  Administered 2021-03-21: 40 mg via INTRAMUSCULAR

## 2021-03-21 NOTE — Progress Notes (Signed)
Patient has no new concerns today

## 2021-03-22 ENCOUNTER — Encounter: Payer: Self-pay | Admitting: *Deleted

## 2021-03-22 ENCOUNTER — Telehealth (INDEPENDENT_AMBULATORY_CARE_PROVIDER_SITE_OTHER): Payer: Medicare Other | Admitting: Internal Medicine

## 2021-03-22 ENCOUNTER — Encounter: Payer: Self-pay | Admitting: Internal Medicine

## 2021-03-22 ENCOUNTER — Encounter: Payer: Self-pay | Admitting: Family Medicine

## 2021-03-22 VITALS — Ht 71.0 in | Wt 272.0 lb

## 2021-03-22 DIAGNOSIS — Z79899 Other long term (current) drug therapy: Secondary | ICD-10-CM

## 2021-03-22 NOTE — Progress Notes (Signed)
Established  Patient Office Visit  Subjective:  Patient ID: Ronald [name], male    DOB: 1941/02/06  Age: 80 y.o. MRN: [id number]  CC:  Chief Complaint  Patient presents with   Follow-up    HPI Ronald [name] presents for review/follow up of chronic med issues including hypertension. Patient complaint today is regarding increasing right knee pain. Denies known recent trauma or injury. Does report a history of arthritis.   Past Medical History:  Diagnosis Date   Hypertension    Obesity (BMI 30-39.9) 06/24/2020    Past Surgical History:  Procedure Laterality Date   CARDIOVERSION N/A 09/19/2020   Procedure: CARDIOVERSION;  Surgeon: Lelon Perla, MD;  Location: [facility] ENDOSCOPY;  Service: Cardiovascular;  Laterality: N/A;   TEE WITHOUT CARDIOVERSION N/A 09/19/2020   Procedure: TRANSESOPHAGEAL ECHOCARDIOGRAM (TEE);  Surgeon: Lelon Perla, MD;  Location: [facility] ENDOSCOPY;  Service: Cardiovascular;  Laterality: N/A;   TONSILLECTOMY      No family history on file.  Social History   Socioeconomic History   Marital status: Married    Spouse name: Not on file   Number of children: Not on file   Years of education: Not on file   Highest education level: Not on file  Occupational History   Not on file  Tobacco Use   Smoking status: Former    Packs/day: 0.50    Years: 40.00    Pack years: 20.00    Types: Cigarettes   Smokeless tobacco: Never  Substance and Sexual Activity   Alcohol use: Yes   Drug use: Not on file   Sexual activity: Not on file  Other Topics Concern   Not on file  Social History Narrative   Not on file   Social Determinants of Health   Financial Resource Strain: Not on file  Food Insecurity: Not on file  Transportation Needs: Not on file  Physical Activity: Not on file  Stress: Not on file  Social Connections: Not on file  Intimate Partner Violence: Not on file    ROS Review of Systems  Objective:   Today's Vitals: BP (!) 142/82   Pulse  67   Temp 97.8 F (36.6 C) (Oral)   Resp 16   Ht 6\' 9"  (2.057 m)   Wt 267 lb (121.1 kg)   SpO2 94%   BMI 28.61 kg/m   Physical Exam Vitals and nursing note reviewed.  Constitutional:      General: He is not in acute distress.    Appearance: He is obese.  Cardiovascular:     Rate and Rhythm: Normal rate and regular rhythm.  Pulmonary:     Effort: Pulmonary effort is normal.     Breath sounds: Normal breath sounds.  Abdominal:     Palpations: Abdomen is soft.     Tenderness: There is no abdominal tenderness.  Musculoskeletal:     Right knee: No deformity. Decreased range of motion. Tenderness present.     Right lower leg: Tenderness present. No edema.     Left lower leg: No edema.  Neurological:     General: No focal deficit present.     Mental Status: He is alert and oriented to person, place, and time.    Assessment & Plan:  1. Right leg pain Kenalog IM injection given.  - triamcinolone acetonide (KENALOG-40) injection 40 mg  2. Hypertension, unspecified type Slightly elevated reading. Patient with wch? Will monitor  3. Atrial fibrillation with RVR (HCC) Appears stable. Management as per consultant  4. Morbid obesity ([place]) Discussed dietary and activity options - goal is 2-4lbs wt loss/mo    Outpatient Encounter Medications as of 03/21/2021  Medication Sig   amiodarone (PACERONE) 200 MG tablet Take 1 tablet (200 mg total) by mouth daily.   apixaban (ELIQUIS) 5 MG TABS tablet Take 1 tablet (5 mg total) by mouth 2 (two) times daily.   brimonidine-timolol (COMBIGAN) 0.2-0.5 % ophthalmic solution Place 1 drop into both eyes every 12 (twelve) hours.   clobetasol cream (TEMOVATE) 9.56 % Apply 1 application topically daily as needed (rash).   furosemide (LASIX) 80 MG tablet TAKE 1 TABLET DAILY   metoprolol succinate (TOPROL XL) 50 MG 24 hr tablet Take 1 tablet (50 mg total) by mouth daily. Take with or immediately following a meal.   Multiple Vitamin (MULTIVITAMIN)  tablet Take 1 tablet by mouth daily.   Multiple Vitamins-Minerals (PRESERVISION AREDS 2) CAPS Take 1 capsule by mouth in the morning and at bedtime.   Omega 3 1200 MG CAPS Take 1,200 mg by mouth daily.   spironolactone (ALDACTONE) 25 MG tablet Take 0.5 tablets (12.5 mg total) by mouth daily.   vitamin B-12 (CYANOCOBALAMIN) 1000 MCG tablet Take 1,000 mcg by mouth daily.   [EXPIRED] triamcinolone acetonide (KENALOG-40) injection 40 mg    No facility-administered encounter medications on file as of 03/21/2021.    Follow-up: Return in about 6 months (around 09/19/2021) for follow up.   Becky Sax, MD

## 2021-03-22 NOTE — Telephone Encounter (Signed)
VV with Dr. Margaretann Loveless at 9:20 AM.

## 2021-03-22 NOTE — Patient Instructions (Addendum)
Medication Instructions:  Your physician recommends that you continue on your current medications as directed. Please refer to the Current Medication list given to you today.  *If you need a refill on your cardiac medications before your next appointment, please call your pharmacy*  Lab Work: Please return for labs in 1-2 weeks (CMET, Thyroid panel)  Our in office lab hours are Monday-Friday 8:00-4:00, closed for lunch 12:45-1:45 pm.  No appointment needed.  Follow-Up: At [facility], you and your health needs are our priority.  As part of our continuing mission to provide you with exceptional heart care, we have created designated Provider Care Teams.  These Care Teams include your primary Cardiologist (physician) and Advanced Practice Providers (APPs -  Physician Assistants and Nurse Practitioners) who all work together to provide you with the care you need, when you need it.  We recommend signing up for the patient portal called "MyChart".  Sign up information is provided on this After Visit Summary.  MyChart is used to connect with patients for Virtual Visits (Telemedicine).  Patients are able to view lab/test results, encounter notes, upcoming appointments, etc.  Non-urgent messages can be sent to your provider as well.   To learn more about what you can do with MyChart, go to [email or web address].    Your next appointment:   3 month(s)  The format for your next appointment:   In Person  Provider:   You will see one of the following Advanced Practice Providers on your designated Care Team:   Almyra Deforest PA [place]  6 months with Dr. Margaretann Loveless

## 2021-04-13 DIAGNOSIS — Z79899 Other long term (current) drug therapy: Secondary | ICD-10-CM | POA: Diagnosis not present

## 2021-04-13 LAB — TSH+T4F+T3FREE
Free T4: 1.27 ng/dL (ref 0.82–1.77)
T3, Free: 2.1 pg/mL (ref 2.0–4.4)
TSH: 2.14 u[IU]/mL (ref 0.450–4.500)

## 2021-04-13 LAB — COMPREHENSIVE METABOLIC PANEL
ALT: 19 IU/L (ref 0–44)
AST: 18 IU/L (ref 0–40)
Albumin/Globulin Ratio: 1.5 (ref 1.2–2.2)
Albumin: 3.8 g/dL (ref 3.7–4.7)
Alkaline Phosphatase: 92 IU/L (ref 44–121)
BUN/Creatinine Ratio: 14 (ref 10–24)
BUN: 16 mg/dL (ref 8–27)
Bilirubin Total: 0.5 mg/dL (ref 0.0–1.2)
CO2: 28 mmol/L (ref 20–29)
Calcium: 9.1 mg/dL (ref 8.6–10.2)
Chloride: 103 mmol/L (ref 96–106)
Creatinine, Ser: 1.14 mg/dL (ref 0.76–1.27)
Globulin, Total: 2.5 g/dL (ref 1.5–4.5)
Glucose: 88 mg/dL (ref 70–99)
Potassium: 4 mmol/L (ref 3.5–5.2)
Sodium: 141 mmol/L (ref 134–144)
Total Protein: 6.3 g/dL (ref 6.0–8.5)
eGFR: 65 mL/min/{1.73_m2} (ref >59)

## 2021-05-02 ENCOUNTER — Encounter: Payer: Self-pay | Admitting: Family Medicine

## 2021-05-04 ENCOUNTER — Other Ambulatory Visit: Payer: Self-pay | Admitting: Family Medicine

## 2021-05-04 DIAGNOSIS — M25561 Pain in right knee: Secondary | ICD-10-CM

## 2021-05-04 DIAGNOSIS — M79604 Pain in right leg: Secondary | ICD-10-CM

## 2021-05-11 ENCOUNTER — Ambulatory Visit (INDEPENDENT_AMBULATORY_CARE_PROVIDER_SITE_OTHER): Payer: Medicare Other | Admitting: Orthopaedic Surgery

## 2021-05-11 ENCOUNTER — Ambulatory Visit (INDEPENDENT_AMBULATORY_CARE_PROVIDER_SITE_OTHER): Payer: Medicare Other

## 2021-05-11 ENCOUNTER — Encounter: Payer: Self-pay | Admitting: Orthopaedic Surgery

## 2021-05-11 ENCOUNTER — Other Ambulatory Visit: Payer: Self-pay

## 2021-05-11 DIAGNOSIS — M25561 Pain in right knee: Secondary | ICD-10-CM

## 2021-05-11 DIAGNOSIS — G8929 Other chronic pain: Secondary | ICD-10-CM | POA: Diagnosis not present

## 2021-05-11 MED ORDER — BUPIVACAINE HCL 0.5 % IJ SOLN
2.0000 mL | INTRAMUSCULAR | Status: AC | PRN
  Administered 2021-05-11: 2 mL via INTRA_ARTICULAR

## 2021-05-11 MED ORDER — LIDOCAINE HCL 1 % IJ SOLN
2.0000 mL | INTRAMUSCULAR | Status: AC | PRN
  Administered 2021-05-11: 2 mL

## 2021-05-11 MED ORDER — METHYLPREDNISOLONE ACETATE 40 MG/ML IJ SUSP
40.0000 mg | INTRAMUSCULAR | Status: AC | PRN
  Administered 2021-05-11: 40 mg via INTRA_ARTICULAR

## 2021-05-11 NOTE — Progress Notes (Signed)
Office Visit Note   Patient: Ronald [name]           Date of Birth: July 06, 1940           MRN: [id number] Visit Date: 05/11/2021              Requested by: Ronald [name], Ronald [name],  Ronald [name] PCP: Ronald Mai, MD   Assessment & Plan: Visit Diagnoses:  1. Chronic pain of right knee     Plan: Impression is right knee DJD.  X-rays reviewed with the patient today.  Treatment options were discussed and he is agreeable to cortisone injection which he tolerated well today.  We will see him back as needed.  Follow-Up Instructions: No follow-ups on file.   Orders:  Orders Placed This Encounter  Procedures   XR KNEE 3 VIEW RIGHT   No orders of the defined types were placed in this encounter.     Procedures: Large Joint Inj: R knee on 05/11/2021 10:46 AM Indications: pain Details: 22 G needle  Arthrogram: No  Medications: 40 mg methylPREDNISolone acetate 40 MG/ML; 2 mL lidocaine 1 %; 2 mL bupivacaine 0.5 % Consent was given by the patient. Patient was prepped and draped in the usual sterile fashion.      Clinical Data: No additional findings.   Subjective: Chief Complaint  Patient presents with   Right Knee - Pain   Right Leg - Pain    HPI  Rush Landmark is a 80 year old gentleman here for evaluation of chronic right knee pain that overall is getting worse.  Denies any injuries.  He describes a numbness pain in the distal thigh and knee with prolonged standing walking and going up steps.  Denies any mechanical symptoms.  He had IM Toradol injection a month ago which did not help.  Ibuprofen provides temporary relief.  Review of Systems   Objective: Vital Signs: There were no vitals taken for this visit.  Physical Exam  Ortho Exam  Right knee shows a trace effusion.  1+ patellofemoral crepitus with range of motion which is relatively well-preserved.  Collaterals and cruciates are stable.  No joint line tenderness.  Specialty  Comments:  No specialty comments available.  Imaging: XR KNEE 3 VIEW RIGHT  Result Date: 05/11/2021 Moderate tricompartmental DJD.  About 50% joint space narrowing of the medial compartment.    PMFS History: Patient Active Problem List   Diagnosis Date Noted   Paroxysmal atrial fibrillation ([place]) 09/14/2020   Hypervolemia 09/14/2020   Congestive heart failure ([place]) 09/14/2020   Morbid obesity ([place]) 09/14/2020   Atrial fibrillation with RVR ([place]) 09/14/2020   Atrial fibrillation with rapid ventricular response ([place]) 09/14/2020   Obesity (BMI 30-39.9) 06/24/2020   Past Medical History:  Diagnosis Date   Hypertension    Obesity (BMI 30-39.9) 06/24/2020    History reviewed. No pertinent family history.  Past Surgical History:  Procedure Laterality Date   CARDIOVERSION N/A 09/19/2020   Procedure: CARDIOVERSION;  Surgeon: Lelon Perla, MD;  Location: [facility] ENDOSCOPY;  Service: Cardiovascular;  Laterality: N/A;   TEE WITHOUT CARDIOVERSION N/A 09/19/2020   Procedure: TRANSESOPHAGEAL ECHOCARDIOGRAM (TEE);  Surgeon: Lelon Perla, MD;  Location: [facility] ENDOSCOPY;  Service: Cardiovascular;  Laterality: N/A;   TONSILLECTOMY     Social History   Occupational History   Not on file  Tobacco Use   Smoking status: Former    Packs/day: 0.50    Years: 40.00    Pack years: 20.00  Types: Cigarettes   Smokeless tobacco: Never  Substance and Sexual Activity   Alcohol use: Yes   Drug use: Not on file   Sexual activity: Not on file

## 2021-05-18 ENCOUNTER — Encounter: Payer: Self-pay | Admitting: Orthopaedic Surgery

## 2021-05-24 ENCOUNTER — Other Ambulatory Visit: Payer: Self-pay

## 2021-05-24 ENCOUNTER — Encounter (INDEPENDENT_AMBULATORY_CARE_PROVIDER_SITE_OTHER): Payer: Medicare Other | Admitting: Ophthalmology

## 2021-05-24 DIAGNOSIS — H35033 Hypertensive retinopathy, bilateral: Secondary | ICD-10-CM

## 2021-05-24 DIAGNOSIS — I10 Essential (primary) hypertension: Secondary | ICD-10-CM

## 2021-05-24 DIAGNOSIS — H353134 Nonexudative age-related macular degeneration, bilateral, advanced atrophic with subfoveal involvement: Secondary | ICD-10-CM

## 2021-05-24 DIAGNOSIS — H43813 Vitreous degeneration, bilateral: Secondary | ICD-10-CM | POA: Diagnosis not present

## 2021-06-01 NOTE — Progress Notes (Signed)
Virtual Visit via Video Note   This visit type was conducted due to national recommendations for restrictions regarding the COVID-19 Pandemic (e.g. social distancing) in an effort to limit this patient's exposure and mitigate transmission in our community.  Due to his co-morbid illnesses, this patient is at least at moderate risk for complications without adequate follow up.  This format is felt to be most appropriate for this patient at this time.  All issues noted in this document were discussed and addressed.  A limited physical exam was performed with this format.  Please refer to the patient's chart for his consent to telehealth for [facility].  Date:  03/22/2021   ID:  Shirlyn Goltz, DOB 1941/03/27, MRN [id number] The patient was identified using 2 identifiers.  Patient Location: Home Provider Location: Office/Clinic   PCP:  [name], [facility] Providers Cardiologist:  Elouise Munroe, MD     Evaluation Performed:  Follow-Up Visit  Chief Complaint:  HF, afib  History of Present Illness:    Ronald [name] is a 80 y.o. male with history of atrial fibrillation leading to a presumed tachycardia mediated cardiomyopathy presenting with systolic heart failure, also with a history of hypertension and morbid obesity.   Feels well today. The patient denies chest pain, chest pressure, dyspnea at rest or with exertion, palpitations, PND, orthopnea, or leg swelling. Denies cough, fever, chills. Denies nausea, vomiting. Denies syncope or presyncope. Denies dizziness or lightheadedness.   Echo reviewed, recovery of EF while in SR.  The patient does not have symptoms concerning for COVID-19 infection (fever, chills, cough, or new shortness of breath).    Past Medical History:  Diagnosis Date   Hypertension    Obesity (BMI 30-39.9) 06/24/2020   Past Surgical History:  Procedure Laterality Date   CARDIOVERSION N/A 09/19/2020   Procedure: CARDIOVERSION;  Surgeon:  Lelon Perla, MD;  Location: [facility] ENDOSCOPY;  Service: Cardiovascular;  Laterality: N/A;   TEE WITHOUT CARDIOVERSION N/A 09/19/2020   Procedure: TRANSESOPHAGEAL ECHOCARDIOGRAM (TEE);  Surgeon: Lelon Perla, MD;  Location: [facility] ENDOSCOPY;  Service: Cardiovascular;  Laterality: N/A;   TONSILLECTOMY       Current Meds  Medication Sig   amiodarone (PACERONE) 200 MG tablet Take 1 tablet (200 mg total) by mouth daily.   apixaban (ELIQUIS) 5 MG TABS tablet Take 1 tablet (5 mg total) by mouth 2 (two) times daily.   brimonidine-timolol (COMBIGAN) 0.2-0.5 % ophthalmic solution Place 1 drop into both eyes every 12 (twelve) hours.   clobetasol cream (TEMOVATE) 2.20 % Apply 1 application topically daily as needed (rash).   furosemide (LASIX) 80 MG tablet TAKE 1 TABLET DAILY   metoprolol succinate (TOPROL XL) 50 MG 24 hr tablet Take 1 tablet (50 mg total) by mouth daily. Take with or immediately following a meal.   Multiple Vitamin (MULTIVITAMIN) tablet Take 1 tablet by mouth daily.   Multiple Vitamins-Minerals (PRESERVISION AREDS 2) CAPS Take 1 capsule by mouth in the morning and at bedtime.   Omega 3 1200 MG CAPS Take 1,200 mg by mouth daily.   spironolactone (ALDACTONE) 25 MG tablet Take 0.5 tablets (12.5 mg total) by mouth daily.   vitamin B-12 (CYANOCOBALAMIN) 1000 MCG tablet Take 1,000 mcg by mouth daily.     Allergies:   Patient has no allergy information on record.   Social History   Tobacco Use   Smoking status: Former    Packs/day: 0.50    Years: 40.00    Pack  years: 20.00    Types: Cigarettes   Smokeless tobacco: Never  Substance Use Topics   Alcohol use: Yes     Family Hx: The patient's family history is not on file.  ROS:   Please see the history of present illness.     All other systems reviewed and are negative.   Prior CV studies:   The following studies were reviewed today:    Labs/Other Tests and Data Reviewed:    EKG:  No ECG reviewed.  Recent  Labs: 09/14/2020: B Natriuretic Peptide 302.1 09/19/2020: Hemoglobin 13.8; Platelets 169 04/13/2021: ALT 19; BUN 16; Creatinine, Ser 1.14; Potassium 4.0; Sodium 141; TSH 2.140   Recent Lipid Panel Lab Results  Component Value Date/Time   CHOL 142 09/15/2020 02:51 AM   CHOL 203 05/21/2019 12:00 AM   TRIG 61 09/15/2020 02:51 AM   TRIG 113 05/21/2019 12:00 AM   HDL 58 09/15/2020 02:51 AM   HDL 62 05/21/2019 12:00 AM   CHOLHDL 2.4 09/15/2020 02:51 AM   LDLCALC 72 09/15/2020 02:51 AM   LDLCALC 121 05/21/2019 12:00 AM    Wt Readings from Last 3 Encounters:  03/22/21 272 lb (123.4 kg)  03/21/21 267 lb (121.1 kg)  02/20/21 272 lb 12.8 oz (123.7 kg)     Risk Assessment/Calculations:    CHA2DS2-VASc Score = 4  This indicates a 4.8% annual risk of stroke. The patient's score is based upon: CHF History: 1 HTN History: 1 Diabetes History: 0 Stroke History: 0 Vascular Disease History: 0 Age Score: 2 Gender Score: 0      Objective:    Vital Signs:  Ht 5\' 11"  (1.803 m)    Wt 272 lb (123.4 kg)    BMI 37.94 kg/m    VITAL SIGNS:  reviewed GEN:  no acute distress EYES:  sclerae anicteric, EOMI - Extraocular Movements Intact RESPIRATORY:  normal respiratory effort, symmetric expansion CARDIOVASCULAR:  no peripheral edema SKIN:  no rash, lesions or ulcers. MUSCULOSKELETAL:  no obvious deformities. NEURO:  alert and oriented x 3, no obvious focal deficit PSYCH:  normal affect  ASSESSMENT & PLAN:    Chronic systolic congestive heart failure ([place])  -echo shows recovery of EF while in SR.  Heart Failure Therapy ACE-I/ARB/ARNI: Held in the setting of low normal blood pressures and suspicion that reduced LVEF was related to A. fib RVR. Per medical record did not tolerate irbesartan during hospitalization and may not tolerate Entresto. With EF recovery, hold for now. BB: Metoprolol succinate 50 mg daily MRA: Spironolactone 12.5 mg daily SGLT2I: None currently Diuretic plan: Continues  on Lasix 80 mg daily, helps with LE swelling. NYHA class I-II heart failure symptoms at this time.   Paroxysmal atrial fibrillation (HCC) Secondary hypercoagulable state ([place]) -Continues on amiodarone 200 mg daily with maintenance of sinus rhythm, continue for now. He feels his atrial fibrillation was situational due to a traumatic fall. Participated in shared decision making, will plan to continue amiodarone for now for maintenance of SR, will check labs, thyroid cascade and LFTs.  -Continue Eliquis 5 mg twice daily. -Continue metoprolol succinate 50 mg daily.   Essential hypertension -Blood pressure is currently stable and well-controlled on Lasix 80 mg daily, metoprolol succinate 50 mg daily, and spironolactone 12.5 mg daily.  Continue.   Morbid obesity (HCC)-discussed increasing activity level which is limited by orthopedic knee issues, now that he is not significantly limited from heart failure symptoms, he will continue to address this from a mobility standpoint and speak to  his primary care physician further.      COVID-19 Education: The signs and symptoms of COVID-19 were discussed with the patient and how to seek care for testing (follow up with PCP or arrange E-visit).  The importance of social distancing was discussed today.  Time:   Today, I have spent 20 minutes with the patient with telehealth technology discussing the above problems.     Medication Adjustments/Labs and Tests Ordered: Current medicines are reviewed at length with the patient today.  Concerns regarding medicines are outlined above.   Tests Ordered: Orders Placed This Encounter  Procedures   Comprehensive metabolic panel   [id number]    Medication Changes: No orders of the defined types were placed in this encounter.  Patient Instructions  Medication Instructions:  Your physician recommends that you continue on your current medications as directed. Please refer to the Current Medication list  given to you today.  *If you need a refill on your cardiac medications before your next appointment, please call your pharmacy*  Lab Work: Please return for labs in 1-2 weeks (CMET, Thyroid panel)  Our in office lab hours are Monday-Friday 8:00-4:00, closed for lunch 12:45-1:45 pm.  No appointment needed.  Follow-Up: At [facility], you and your health needs are our priority.  As part of our continuing mission to provide you with exceptional heart care, we have created designated Provider Care Teams.  These Care Teams include your primary Cardiologist (physician) and Advanced Practice Providers (APPs -  Physician Assistants and Nurse Practitioners) who all work together to provide you with the care you need, when you need it.  We recommend signing up for the patient portal called "MyChart".  Sign up information is provided on this After Visit Summary.  MyChart is used to connect with patients for Virtual Visits (Telemedicine).  Patients are able to view lab/test results, encounter notes, upcoming appointments, etc.  Non-urgent messages can be sent to your provider as well.   To learn more about what you can do with MyChart, go to [email or web address].    Your next appointment:   3 month(s)  The format for your next appointment:   In Person  Provider:   You will see one of the following Advanced Practice Providers on your designated Care Team:   Almyra Deforest PA [place]  6 months with Dr. Margaretann Loveless      Signed, Elouise Munroe, MD  03/22/2021 4:58 PM    [place]

## 2021-06-26 ENCOUNTER — Other Ambulatory Visit: Payer: Self-pay | Admitting: Adult Health

## 2021-06-26 ENCOUNTER — Other Ambulatory Visit: Payer: Self-pay | Admitting: Cardiology

## 2021-06-26 DIAGNOSIS — R600 Localized edema: Secondary | ICD-10-CM

## 2021-06-27 ENCOUNTER — Ambulatory Visit: Payer: Medicare Other | Admitting: Physician Assistant

## 2021-07-19 DIAGNOSIS — L820 Inflamed seborrheic keratosis: Secondary | ICD-10-CM | POA: Diagnosis not present

## 2021-07-19 DIAGNOSIS — L304 Erythema intertrigo: Secondary | ICD-10-CM | POA: Diagnosis not present

## 2021-07-19 DIAGNOSIS — Z1283 Encounter for screening for malignant neoplasm of skin: Secondary | ICD-10-CM | POA: Diagnosis not present

## 2021-07-19 DIAGNOSIS — D225 Melanocytic nevi of trunk: Secondary | ICD-10-CM | POA: Diagnosis not present

## 2021-07-26 MED ORDER — APIXABAN 5 MG PO TABS: 5.0000 mg | ORAL_TABLET | Freq: Two times a day (BID) | ORAL | 1 refills | Status: DC

## 2021-08-05 ENCOUNTER — Encounter: Payer: Self-pay | Admitting: Family Medicine

## 2021-08-28 ENCOUNTER — Encounter: Payer: Self-pay | Admitting: Family Medicine

## 2021-09-01 ENCOUNTER — Ambulatory Visit (INDEPENDENT_AMBULATORY_CARE_PROVIDER_SITE_OTHER): Payer: Medicare Other

## 2021-09-01 VITALS — Ht 71.0 in | Wt 270.0 lb

## 2021-09-01 DIAGNOSIS — Z0000 Encounter for general adult medical examination without abnormal findings: Secondary | ICD-10-CM

## 2021-09-01 NOTE — Progress Notes (Addendum)
I connected with  Shirlyn Goltz on 09/07/21 by a audio enabled telemedicine application and verified that I am speaking with the correct person using two identifiers. ? ?Patient Location: Home ? ?Provider Location: Office/Clinic ? ?I discussed the limitations of evaluation and management by telemedicine. The patient expressed understanding and agreed to proceed.  ?Subjective:  ? Ronald [name] is a 81 y.o. male who presents for Medicare Annual/Subsequent preventive examination. ? ?   ?Objective:  ?  ?Today's Vitals  ? 09/01/21 1301  ?Weight: 270 lb (122.5 kg)  ?Height: '5\' 11"'$  (1.803 m)  ? ?Body mass index is 37.66 kg/m?. ? ? ?  09/14/2020  ?  9:00 PM  ?Advanced Directives  ?Does Patient Have a Medical Advance Directive? No  ?Would patient like information on creating a medical advance directive? No - Guardian declined  ? ? ?Current Medications (verified) ?Outpatient Encounter Medications as of 09/01/2021  ?Medication Sig  ? amiodarone (PACERONE) 200 MG tablet TAKE 1 TABLET DAILY  ? brimonidine-timolol (COMBIGAN) 0.2-0.5 % ophthalmic solution Place 1 drop into both eyes every 12 (twelve) hours.  ? clobetasol cream (TEMOVATE) 6.94 % Apply 1 application topically daily as needed (rash).  ? furosemide (LASIX) 80 MG tablet TAKE 1 TABLET DAILY  ? metoprolol succinate (TOPROL-XL) 50 MG 24 hr tablet TAKE 1 TABLET DAILY, WITH  OR IMMEDIATELY FOLLOWING A MEAL  ? Multiple Vitamin (MULTIVITAMIN) tablet Take 1 tablet by mouth daily.  ? Multiple Vitamins-Minerals (PRESERVISION AREDS 2) CAPS Take 1 capsule by mouth in the morning and at bedtime.  ? Omega 3 1200 MG CAPS Take 1,200 mg by mouth daily.  ? spironolactone (ALDACTONE) 25 MG tablet TAKE 1/2 TABLET(12.'5MG'$      TOTAL) DAILY  ? vitamin B-12 (CYANOCOBALAMIN) 1000 MCG tablet Take 1,000 mcg by mouth daily.  ? [DISCONTINUED] apixaban (ELIQUIS) 5 MG TABS tablet Take 1 tablet (5 mg total) by mouth 2 (two) times daily.  ? ?No facility-administered encounter medications on file as  of 09/01/2021.  ? ? ?Allergies (verified) ?Patient has no known allergies.  ? ?History: ?Past Medical History:  ?Diagnosis Date  ? Hypertension   ? Obesity (BMI 30-39.9) 06/24/2020  ? ?Past Surgical History:  ?Procedure Laterality Date  ? CARDIOVERSION N/A 09/19/2020  ? Procedure: CARDIOVERSION;  Surgeon: Lelon Perla, MD;  Location: [facility] ENDOSCOPY;  Service: Cardiovascular;  Laterality: N/A;  ? TEE WITHOUT CARDIOVERSION N/A 09/19/2020  ? Procedure: TRANSESOPHAGEAL ECHOCARDIOGRAM (TEE);  Surgeon: Lelon Perla, MD;  Location: [facility] ENDOSCOPY;  Service: Cardiovascular;  Laterality: N/A;  ? TONSILLECTOMY    ? ?No family history on file. ?Social History  ? ?Socioeconomic History  ? Marital status: Married  ?  Spouse name: Not on file  ? Number of children: Not on file  ? Years of education: Not on file  ? Highest education level: Not on file  ?Occupational History  ? Not on file  ?Tobacco Use  ? Smoking status: Former  ?  Packs/day: 0.50  ?  Years: 40.00  ?  Pack years: 20.00  ?  Types: Cigarettes  ? Smokeless tobacco: Never  ?Substance and Sexual Activity  ? Alcohol use: Yes  ? Drug use: Not on file  ? Sexual activity: Not on file  ?Other Topics Concern  ? Not on file  ?Social History Narrative  ? Not on file  ? ?Social Determinants of Health  ? ?Financial Resource Strain: Low Risk   ? Difficulty of Paying Living Expenses: Not hard at all  ?Food Insecurity:  No Food Insecurity  ? Worried About Charity fundraiser in the Last Year: Never true  ? Ran Out of Food in the Last Year: Never true  ?Transportation Needs: No Transportation Needs  ? Lack of Transportation (Medical): No  ? Lack of Transportation (Non-Medical): No  ?Physical Activity: Inactive  ? Days of Exercise per Week: 0 days  ? Minutes of Exercise per Session: 0 min  ?Stress: No Stress Concern Present  ? Feeling of Stress : Only a little  ?Social Connections: Not on file  ? ? ?Tobacco Counseling ?Counseling given: Not Answered ? ? ?Clinical  Intake: ? ?Pre-visit preparation completed: Yes ? ?Pain : No/denies pain ? ?  ? ?Diabetes: No ? ?How often do you need to have someone help you when you read instructions, pamphlets, or other written materials from your doctor or pharmacy?: 1 - Never ?What is the last grade level you completed in school?: masters degree ? ?Diabetic?no ? ?Interpreter Needed?: No ? ?  ? ? ?Activities of Daily Living ? ?  08/28/2021  ? 12:38 PM 09/14/2020  ?  9:00 PM  ?In your present state of health, do you have any difficulty performing the following activities:  ?Hearing? 0 0  ?Vision? 1 0  ?Difficulty concentrating or making decisions? 0 0  ?Walking or climbing stairs? 1 0  ?Dressing or bathing? 1 0  ?Doing errands, shopping? 1 0  ?Preparing Food and eating ? N   ?Using the Toilet? N   ?In the past six months, have you accidently leaked urine? Y   ?Do you have problems with loss of bowel control? Y   ?Managing your Medications? N   ?Managing your Finances? N   ?Housekeeping or managing your Housekeeping? N   ? ? ?Patient Care Team: ?[name], MD as PCP - General (Family Medicine) ?Elouise Munroe, MD as PCP - Cardiology (Cardiology) ? ?Indicate any recent Medical Services you may have received from other than [facility] providers in the past year (date may be approximate). ? ?   ?Assessment:  ? This is a routine wellness examination for Ronald [name]. ? ?Hearing/Vision screen ?No results found. ? ?Dietary issues and exercise activities discussed: ?  ? ? Goals Addressed   ?None ?  ?Depression Screen ? ?  09/01/2021  ?  1:04 PM 09/06/2020  ?  4:00 PM 06/03/2019  ?  3:04 PM  ?PHQ 2/9 Scores  ?PHQ - 2 Score 0 0 0  ?PHQ- 9 Score  6   ?  ?Fall Risk ? ?  08/28/2021  ? 12:38 PM 09/06/2020  ?  3:59 PM 06/03/2019  ?  3:04 PM  ?Fall Risk   ?Falls in the past year? 0 1 0  ?Number falls in past yr: 0 1 0  ?Injury with Fall? 0 0 0  ?Risk for fall due to :  Impaired mobility   ?Follow up  Falls evaluation completed   ? ? ?FALL RISK PREVENTION PERTAINING TO  THE HOME: ? ?Any stairs in or around the home? Yes  ?If so, are there any without handrails? Yes  ?Home free of loose throw rugs in walkways, pet beds, electrical cords, etc? Yes  ?Adequate lighting in your home to reduce risk of falls? Yes  ? ?ASSISTIVE DEVICES UTILIZED TO PREVENT FALLS: ? ?Life alert? No  ?Use of a cane, walker or w/c? No  ?Grab bars in the bathroom? Yes  ?Shower chair or bench in shower? No  ?Elevated toilet seat or a  handicapped toilet? No  ? ?Cognitive Function: ?  ?  ?  ? ?Immunizations ?Immunization History  ?Administered Date(s) Administered  ? Fluad Quad(high Dose 65+) 02/06/2019  ? Influenza, High Dose Seasonal PF 03/01/2021  ? PFIZER Comirnaty(Gray Top)Covid-19 Tri-Sucrose Vaccine 03/01/2021  ? PFIZER(Purple Top)SARS-COV-2 Vaccination 06/16/2019, 07/06/2019, 07/06/2019, 02/23/2020  ? PNEUMOCOCCAL CONJUGATE-20 03/01/2021  ? Pneumococcal Conjugate-13 02/15/2014  ? Pneumococcal Polysaccharide-23 04/17/2007  ? Tdap 08/09/2011  ? Zoster Recombinat (Shingrix) 03/01/2021, 08/04/2021  ? ? ?TDAP status: Up to date ? ?Flu Vaccine status: Up to date ? ?Pneumococcal vaccine status: Up to date ? ?Covid-19 vaccine status: Completed vaccines ? ?Qualifies for Shingles Vaccine? Yes   ?Zostavax completed Yes   ?Shingrix Completed?: Yes ? ?Screening Tests ?Health Maintenance  ?Topic Date Due  ? COVID-19 Vaccine (6 - Booster) 04/26/2021  ? TETANUS/TDAP  08/08/2021  ? INFLUENZA VACCINE  01/02/2022  ? Pneumonia Vaccine 68+ Years old  Completed  ? Hepatitis C Screening  Completed  ? Zoster Vaccines- Shingrix  Completed  ? HPV VACCINES  Aged Out  ? ? ?Health Maintenance ? ?Health Maintenance Due  ?Topic Date Due  ? COVID-19 Vaccine (6 - Booster) 04/26/2021  ? TETANUS/TDAP  08/08/2021  ? ? ?Colorectal cancer screening: Type of screening: Colonoscopy. Completed 2 years ago per pt. Repeat every 1 years ? ?Lung Cancer Screening: (Low Dose CT Chest recommended if Age 57-80 years, 30 pack-year currently smoking OR  have quit w/in 15years.) does qualify.  ? ?Lung Cancer Screening Referral: no ? ?Additional Screening: ? ?Hepatitis C Screening: does qualify; Completed 12/22/2019 ? ?Vision Screening: Recommended annual ophthalmology exams

## 2021-09-05 DIAGNOSIS — I480 Paroxysmal atrial fibrillation: Secondary | ICD-10-CM

## 2021-09-05 MED ORDER — APIXABAN 5 MG PO TABS: 5.0000 mg | ORAL_TABLET | Freq: Two times a day (BID) | ORAL | 1 refills | Status: DC

## 2021-09-13 ENCOUNTER — Encounter: Payer: Self-pay | Admitting: Internal Medicine

## 2021-09-13 DIAGNOSIS — I5022 Chronic systolic (congestive) heart failure: Secondary | ICD-10-CM

## 2021-09-14 ENCOUNTER — Telehealth: Payer: Self-pay

## 2021-09-14 NOTE — Telephone Encounter (Signed)
? ?  Telephone encounter was:  Successful.  ?09/14/2021 ?Name: Jadarion Halbig MRN: [id number] DOB: 04-Jul-1940 ? ?Jia Mohamed is a 81 y.o. year old male who is a primary care patient of [name], MD . The community resource team was consulted for assistance with Transportation Needs  ? ?Care guide performed the following interventions: Patient provided with information about care guide support team and interviewed to confirm resource needs. ? ?Follow Up Plan:  Care guide will follow up with patient by phone over the next day ? ? ? ?Larena Sox ?Care Guide, Embedded Care Coordination ?[place], Care Management  ?[phone] ?[address], [place], [place] [address] ?Phone: [phone] ?Email: [email or web address] ? ?  ?

## 2021-09-14 NOTE — Addendum Note (Signed)
Addended by: Alexander Mt on: 09/14/2021 03:45 PM ? ? Modules accepted: Orders ? ?

## 2021-09-15 ENCOUNTER — Telehealth: Payer: Self-pay

## 2021-09-15 NOTE — Telephone Encounter (Signed)
? ?  Telephone encounter was:  Successful.  ?09/15/2021 ?Name: Archibald Marchetta MRN: [id number] DOB: 08-Oct-1940 ? ?Jaydyn Bozzo is a 81 y.o. year old male who is a primary care patient of [name], MD . The community resource team was consulted for assistance with Transportation Needs  ? ?Care guide performed the following interventions: Patient provided with information about care guide support team and interviewed to confirm resource needs.Patient requested for his appointment to be rescheduled or a virtual visit.  ? ?Follow Up Plan:  No further follow up planned at this time. The patient has been provided with needed resources. ? ? ? ?Larena Sox ?Care Guide, Embedded Care Coordination ?[place], Care Management  ?[phone] ?[address], [place], [place] [address] ?Phone: [phone] ?Email: [email or web address] ? ?  ?

## 2021-09-15 NOTE — Telephone Encounter (Signed)
I contacted patient after speaking with Dr.Acharya, she states we can place him on a virtual visit for Wednesday 04/19, as the 04/17 appointment would not work for a virtual visit due to being in the middle of clinic.  ?We were able to place him on at 10:20 AM on 04/19- patient was made aware, instructions for mychart video visit were sent via mychart visit.  ? ?Patient verbalized understanding, thankful for call back.  ?Routing to Arts development officer for [employer].  ?Thanks! ?

## 2021-09-18 ENCOUNTER — Ambulatory Visit: Payer: Medicare Other | Admitting: Internal Medicine

## 2021-09-20 ENCOUNTER — Encounter: Payer: Self-pay | Admitting: Internal Medicine

## 2021-09-20 ENCOUNTER — Telehealth: Payer: Self-pay

## 2021-09-20 ENCOUNTER — Telehealth (INDEPENDENT_AMBULATORY_CARE_PROVIDER_SITE_OTHER): Payer: Medicare Other | Admitting: Internal Medicine

## 2021-09-20 ENCOUNTER — Encounter: Payer: Self-pay | Admitting: Nurse Practitioner

## 2021-09-20 VITALS — BP 120/66 | HR 51 | Ht 71.0 in | Wt 272.0 lb

## 2021-09-20 DIAGNOSIS — I5022 Chronic systolic (congestive) heart failure: Secondary | ICD-10-CM | POA: Diagnosis not present

## 2021-09-20 DIAGNOSIS — Z79899 Other long term (current) drug therapy: Secondary | ICD-10-CM

## 2021-09-20 DIAGNOSIS — D6869 Other thrombophilia: Secondary | ICD-10-CM | POA: Diagnosis not present

## 2021-09-20 DIAGNOSIS — I10 Essential (primary) hypertension: Secondary | ICD-10-CM | POA: Diagnosis not present

## 2021-09-20 DIAGNOSIS — R0609 Other forms of dyspnea: Secondary | ICD-10-CM

## 2021-09-20 DIAGNOSIS — I480 Paroxysmal atrial fibrillation: Secondary | ICD-10-CM | POA: Diagnosis not present

## 2021-09-20 NOTE — Telephone Encounter (Signed)
?  Patient Consent for Virtual Visit  ? ? ?   ? ?Ronald [name] has provided verbal consent on 09/20/2021 for a virtual visit (video or telephone). ? ? ?CONSENT FOR VIRTUAL VISIT FOR:  Ronald [name]  ?By participating in this virtual visit I agree to the following: ? ?I hereby voluntarily request, consent and authorize [facility] and its employed or contracted physicians, physician assistants, nurse practitioners or other licensed health care professionals (the Practitioner), to provide me with telemedicine health care services (the ?Services") as deemed necessary by the treating Practitioner. I acknowledge and consent to receive the Services by the Practitioner via telemedicine. I understand that the telemedicine visit will involve communicating with the Practitioner through live audiovisual communication technology and the disclosure of certain medical information by electronic transmission. I acknowledge that I have been given the opportunity to request an in-person assessment or other available alternative prior to the telemedicine visit and am voluntarily participating in the telemedicine visit. ? ?I understand that I have the right to withhold or withdraw my consent to the use of telemedicine in the course of my care at any time, without affecting my right to future care or treatment, and that the Practitioner or I may terminate the telemedicine visit at any time. I understand that I have the right to inspect all information obtained and/or recorded in the course of the telemedicine visit and may receive copies of available information for a reasonable fee.  I understand that some of the potential risks of receiving the Services via telemedicine include:  ?Delay or interruption in medical evaluation due to technological equipment failure or disruption; ?Information transmitted may not be sufficient (e.g. poor resolution of images) to allow for appropriate medical decision making by the Practitioner;  and/or  ?In rare instances, security protocols could fail, causing a breach of personal health information. ? ?Furthermore, I acknowledge that it is my responsibility to provide information about my medical history, conditions and care that is complete and accurate to the best of my ability. I acknowledge that Practitioner's advice, recommendations, and/or decision may be based on factors not within their control, such as incomplete or inaccurate data provided by me or distortions of diagnostic images or specimens that may result from electronic transmissions. I understand that the practice of medicine is not an exact science and that Practitioner makes no warranties or guarantees regarding treatment outcomes. I acknowledge that a copy of this consent can be made available to me via my patient portal (Ronald [name]), or I can request a printed copy by calling the office of [facility].   ? ?I understand that my insurance will be billed for this visit.  ? ?I have read or had this consent read to me. ?I understand the contents of this consent, which adequately explains the benefits and risks of the Services being provided via telemedicine.  ?I have been provided ample opportunity to ask questions regarding this consent and the Services and have had my questions answered to my satisfaction. ?I give my informed consent for the services to be provided through the use of telemedicine in my medical care ? ? ? ?

## 2021-09-20 NOTE — Progress Notes (Signed)
? ?Virtual Visit via Video Note  ? ?This visit type was conducted due to national recommendations for restrictions regarding the COVID-19 Pandemic (e.g. social distancing) in an effort to limit this patient's exposure and mitigate transmission in our community.  Due to his co-morbid illnesses, this patient is at least at moderate risk for complications without adequate follow up.  This format is felt to be most appropriate for this patient at this time.  All issues noted in this document were discussed and addressed.  A limited physical exam was performed with this format.  Please refer to the patient's chart for his consent to telehealth for [facility]. ? ?Date:  09/20/21 ? ? ?ID:  Ronald [name], DOB September 25, 1940, MRN [id number] ?The patient was identified using 2 identifiers. ? ?Patient Location: Home ?Provider Location: Office/Clinic ? ? ?PCP:  [name], MD ?  ?[facility] Providers ?Cardiologist:  Elouise Munroe, MD    ? ?Evaluation Performed:  Follow-Up Visit ? ?Chief Complaint:  HF, afib ? ?History of Present Illness:   ? ?Ronald [name] is a 81 y.o. male with history of atrial fibrillation leading to a presumed tachycardia mediated cardiomyopathy presenting with systolic heart failure, also with a history of hypertension and morbid obesity.  ? ?At his last appointment he was feeling well. His Echo was reviewed, notable for recovery of EF while in SR.  ? ?Today, he reports suffering from a cold lately. He tested negative for COVID and is recovering gradually.  ? ?He denies any recurrent arrhythmias. His blood pressure machine has not detected any irregularities. At home his blood pressure has been "up and down", but lately more controlled in the 120s. Since the beginning of this year, he denies any blood pressures higher than 536 systolic. ? ?Currently he takes Lasix 80 mg every day unless he knows he will be away from home. It will cause frequent urination for about 4 hours. ? ?A few months  ago he received a steroid injection for right knee pain. However, he feels this made his condition worse. He was limited to hobbling for a month, unable to walk. At this time he may feel some knee pain while climbing stairs, but he has been feeling a little better overall. He notes being able to walk a few hundred yards. He does not anticipate needing knee surgery in the near future. ? ?The patient denies chest pain, chest pressure, dyspnea at rest or with exertion, palpitations, PND, orthopnea, or leg swelling. Denies fever, chills. Denies nausea, vomiting. Denies syncope or presyncope. Denies dizziness or lightheadedness.  ? ?The patient does not have symptoms concerning for COVID-19 infection (fever, chills, cough, or new shortness of breath).  ? ? ?Past Medical History:  ?Diagnosis Date  ? Hypertension   ? Obesity (BMI 30-39.9) 06/24/2020  ? ?Past Surgical History:  ?Procedure Laterality Date  ? CARDIOVERSION N/A 09/19/2020  ? Procedure: CARDIOVERSION;  Surgeon: Lelon Perla, MD;  Location: [facility] ENDOSCOPY;  Service: Cardiovascular;  Laterality: N/A;  ? TEE WITHOUT CARDIOVERSION N/A 09/19/2020  ? Procedure: TRANSESOPHAGEAL ECHOCARDIOGRAM (TEE);  Surgeon: Lelon Perla, MD;  Location: [facility] ENDOSCOPY;  Service: Cardiovascular;  Laterality: N/A;  ? TONSILLECTOMY    ?  ? ?Current Meds  ?Medication Sig  ? amiodarone (PACERONE) 200 MG tablet TAKE 1 TABLET DAILY  ? apixaban (ELIQUIS) 5 MG TABS tablet Take 1 tablet (5 mg total) by mouth 2 (two) times daily.  ? brimonidine-timolol (COMBIGAN) 0.2-0.5 % ophthalmic solution Place 1 drop into both eyes  every 12 (twelve) hours.  ? clobetasol cream (TEMOVATE) 2.99 % Apply 1 application topically daily as needed (rash).  ? furosemide (LASIX) 80 MG tablet TAKE 1 TABLET DAILY  ? metoprolol succinate (TOPROL-XL) 50 MG 24 hr tablet TAKE 1 TABLET DAILY, WITH  OR IMMEDIATELY FOLLOWING A MEAL  ? Multiple Vitamin (MULTIVITAMIN) tablet Take 1 tablet by mouth daily.  ? Multiple  Vitamins-Minerals (PRESERVISION AREDS 2) CAPS Take 1 capsule by mouth in the morning and at bedtime.  ? Omega 3 1200 MG CAPS Take 1,200 mg by mouth daily.  ? spironolactone (ALDACTONE) 25 MG tablet TAKE 1/2 TABLET(12.'5MG'$      TOTAL) DAILY  ? vitamin B-12 (CYANOCOBALAMIN) 1000 MCG tablet Take 1,000 mcg by mouth daily.  ?  ? ?Allergies:   Patient has no known allergies.  ? ?Social History  ? ?Tobacco Use  ? Smoking status: Former  ?  Packs/day: 0.50  ?  Years: 40.00  ?  Pack years: 20.00  ?  Types: Cigarettes  ? Smokeless tobacco: Never  ?Substance Use Topics  ? Alcohol use: Yes  ?  ? ?Family Hx: ?The patient's family history is not on file. ? ?ROS:   ?Please see the history of present illness.    ?(+) Cough ?(+) Right knee pain ?All other systems reviewed and are negative. ? ? ?Prior CV studies:   ?The following studies were reviewed today: ? ?Echo 03/21/2021: ?Sonographer Comments: Patient is morbidly obese. Image acquisition  ?challenging due to patient body habitus.  ?IMPRESSIONS  ? ? 1. Left ventricular ejection fraction, by estimation, is 55 to 60%. The  ?left ventricle has normal function. The left ventricle has no regional  ?wall motion abnormalities. Left ventricular diastolic parameters are  ?consistent with Grade I diastolic  ?dysfunction (impaired relaxation).  ? 2. Right ventricular systolic function is normal. The right ventricular  ?size is normal. Tricuspid regurgitation signal is inadequate for assessing  ?PA pressure.  ? 3. The mitral valve is grossly normal. Trivial mitral valve  ?regurgitation. No evidence of mitral stenosis.  ? 4. The aortic valve is tricuspid. Aortic valve regurgitation is not  ?visualized. No aortic stenosis is present.  ? 5. Aortic dilatation noted. There is mild dilatation of the aortic root,  ?measuring 41 mm. There is mild dilatation of the ascending aorta,  ?measuring 40 mm.  ? 6. The inferior vena cava is normal in size with greater than 50%  ?respiratory variability,  suggesting right atrial pressure of 3 mmHg.  ? ?Comparison(s): Changes from prior study are noted. The left ventricular  ?function has improved.  ? ? ?Labs/Other Tests and Data Reviewed:   ? ?EKG:  No ECG reviewed. ? ?Recent Labs: ?04/13/2021: ALT 19; BUN 16; Creatinine, Ser 1.14; Potassium 4.0; Sodium 141; TSH 2.140  ? ?Recent Lipid Panel ?Lab Results  ?Component Value Date/Time  ? CHOL 142 09/15/2020 02:51 AM  ? CHOL 203 05/21/2019 12:00 AM  ? TRIG 61 09/15/2020 02:51 AM  ? TRIG 113 05/21/2019 12:00 AM  ? HDL 58 09/15/2020 02:51 AM  ? HDL 62 05/21/2019 12:00 AM  ? CHOLHDL 2.4 09/15/2020 02:51 AM  ? LDLCALC 72 09/15/2020 02:51 AM  ? LDLCALC 121 05/21/2019 12:00 AM  ? ? ?Wt Readings from Last 3 Encounters:  ?09/20/21 272 lb (123.4 kg)  ?09/01/21 270 lb (122.5 kg)  ?03/22/21 272 lb (123.4 kg)  ?  ? ?Risk Assessment/Calculations:   ? ?CHA2DS2-VASc Score = 4  ?This indicates a 4.8% annual risk of stroke. ?The  patient's score is based upon: ?CHF History: 1 ?HTN History: 1 ?Diabetes History: 0 ?Stroke History: 0 ?Vascular Disease History: 0 ?Age Score: 2 ?Gender Score: 0 ? ?    ?Objective:   ? ?Vital Signs:  BP 120/66   Pulse (!) 51   Ht '5\' 11"'$  (1.803 m)   Wt 272 lb (123.4 kg)   BMI 37.94 kg/m?   ? ?VITAL SIGNS:  reviewed ?GEN:  no acute distress ?EYES:  sclerae anicteric, EOMI - Extraocular Movements Intact ?RESPIRATORY:  normal respiratory effort, symmetric expansion ?CARDIOVASCULAR:  no peripheral edema ?SKIN:  no rash, lesions or ulcers. ?MUSCULOSKELETAL:  no obvious deformities. ?NEURO:  alert and oriented x 3, no obvious focal deficit ?PSYCH:  normal affect ? ?ASSESSMENT & PLAN:   ? ?1. Chronic systolic congestive heart failure ([place])   ?2. Paroxysmal atrial fibrillation ([place])   ?3. Medication management   ?4. Secondary hypercoagulable state ([place])   ?5. Essential hypertension   ?6. Morbid obesity ([place])   ?7. Dyspnea on exertion   ? ?Chronic systolic congestive heart failure ([place])  ?-echo shows recovery of EF  while in SR.  ?Heart Failure Therapy ?ACE-I/ARB/ARNI: Held in the setting of low normal blood pressures and suspicion that reduced LVEF was related to A. fib RVR. Per medical record did not tolerate irbesartan during hosp

## 2021-09-20 NOTE — Patient Instructions (Signed)
Medication Instructions:  ?No Changes In Medications at this time.  ?*If you need a refill on your cardiac medications before your next appointment, please call your pharmacy* ? ?Follow-Up: ?At [facility], you and your health needs are our priority.  As part of our continuing mission to provide you with exceptional heart care, we have created designated Provider Care Teams.  These Care Teams include your primary Cardiologist (physician) and Advanced Practice Providers (APPs -  Physician Assistants and Nurse Practitioners) who all work together to provide you with the care you need, when you need it. ? ?We recommend signing up for the patient portal called "MyChart".  Sign up information is provided on this After Visit Summary.  MyChart is used to connect with patients for Virtual Visits (Telemedicine).  Patients are able to view lab/test results, encounter notes, upcoming appointments, etc.  Non-urgent messages can be sent to your provider as well.   ?To learn more about what you can do with MyChart, go to [email or web address].   ? ?Your next appointment:   ?6 month(s) ? ?The format for your next appointment:   ?In Person ? ?Provider:   ?Elouise Munroe, MD   ? ? ? ? ? ?  ?

## 2021-10-03 ENCOUNTER — Ambulatory Visit: Payer: Medicare Other | Admitting: Nurse Practitioner

## 2021-10-12 ENCOUNTER — Ambulatory Visit: Payer: Medicare Other | Admitting: Nurse Practitioner

## 2021-11-28 ENCOUNTER — Other Ambulatory Visit: Payer: Self-pay | Admitting: Cardiology

## 2021-11-28 DIAGNOSIS — R600 Localized edema: Secondary | ICD-10-CM

## 2021-11-29 NOTE — Telephone Encounter (Signed)
Rx refill sent to pharmacy. 

## 2022-01-02 DIAGNOSIS — H0102B Squamous blepharitis left eye, upper and lower eyelids: Secondary | ICD-10-CM | POA: Diagnosis not present

## 2022-01-02 DIAGNOSIS — H40053 Ocular hypertension, bilateral: Secondary | ICD-10-CM | POA: Diagnosis not present

## 2022-01-02 DIAGNOSIS — Z961 Presence of intraocular lens: Secondary | ICD-10-CM | POA: Diagnosis not present

## 2022-01-02 DIAGNOSIS — H04123 Dry eye syndrome of bilateral lacrimal glands: Secondary | ICD-10-CM | POA: Diagnosis not present

## 2022-01-02 DIAGNOSIS — H0102A Squamous blepharitis right eye, upper and lower eyelids: Secondary | ICD-10-CM | POA: Diagnosis not present

## 2022-01-02 DIAGNOSIS — H353134 Nonexudative age-related macular degeneration, bilateral, advanced atrophic with subfoveal involvement: Secondary | ICD-10-CM | POA: Diagnosis not present

## 2022-01-02 DIAGNOSIS — H1045 Other chronic allergic conjunctivitis: Secondary | ICD-10-CM | POA: Diagnosis not present

## 2022-01-25 DIAGNOSIS — G3184 Mild cognitive impairment, so stated: Secondary | ICD-10-CM | POA: Diagnosis not present

## 2022-01-25 DIAGNOSIS — G0439 Other acute necrotizing hemorrhagic encephalopathy: Secondary | ICD-10-CM | POA: Diagnosis not present

## 2022-01-25 DIAGNOSIS — R000 Tachycardia, unspecified: Secondary | ICD-10-CM | POA: Diagnosis not present

## 2022-01-25 DIAGNOSIS — I5042 Chronic combined systolic (congestive) and diastolic (congestive) heart failure: Secondary | ICD-10-CM | POA: Diagnosis not present

## 2022-01-25 DIAGNOSIS — I5020 Unspecified systolic (congestive) heart failure: Secondary | ICD-10-CM | POA: Diagnosis not present

## 2022-01-25 DIAGNOSIS — Z0000 Encounter for general adult medical examination without abnormal findings: Secondary | ICD-10-CM | POA: Diagnosis not present

## 2022-01-25 DIAGNOSIS — I110 Hypertensive heart disease with heart failure: Secondary | ICD-10-CM | POA: Diagnosis not present

## 2022-01-25 DIAGNOSIS — I480 Paroxysmal atrial fibrillation: Secondary | ICD-10-CM | POA: Diagnosis not present

## 2022-01-25 DIAGNOSIS — H3530 Unspecified macular degeneration: Secondary | ICD-10-CM | POA: Diagnosis not present

## 2022-01-25 DIAGNOSIS — D6869 Other thrombophilia: Secondary | ICD-10-CM | POA: Diagnosis not present

## 2022-01-25 DIAGNOSIS — M79606 Pain in leg, unspecified: Secondary | ICD-10-CM | POA: Diagnosis not present

## 2022-01-25 DIAGNOSIS — K635 Polyp of colon: Secondary | ICD-10-CM | POA: Diagnosis not present

## 2022-01-25 DIAGNOSIS — I10 Essential (primary) hypertension: Secondary | ICD-10-CM | POA: Insufficient documentation

## 2022-01-25 DIAGNOSIS — M1711 Unilateral primary osteoarthritis, right knee: Secondary | ICD-10-CM | POA: Diagnosis not present

## 2022-01-25 DIAGNOSIS — R001 Bradycardia, unspecified: Secondary | ICD-10-CM | POA: Diagnosis not present

## 2022-01-26 ENCOUNTER — Encounter: Payer: Self-pay | Admitting: Internal Medicine

## 2022-01-26 ENCOUNTER — Other Ambulatory Visit: Payer: Self-pay | Admitting: Internal Medicine

## 2022-01-26 DIAGNOSIS — M79606 Pain in leg, unspecified: Secondary | ICD-10-CM

## 2022-01-30 ENCOUNTER — Ambulatory Visit
Admission: RE | Admit: 2022-01-30 | Discharge: 2022-01-30 | Disposition: A | Discharge status: Home / Self Care | Payer: Medicare Other | Source: Ambulatory Visit | Attending: Internal Medicine | Admitting: Internal Medicine

## 2022-01-30 DIAGNOSIS — M79606 Pain in leg, unspecified: Secondary | ICD-10-CM

## 2022-01-30 DIAGNOSIS — I739 Peripheral vascular disease, unspecified: Secondary | ICD-10-CM | POA: Diagnosis not present

## 2022-02-07 DIAGNOSIS — M79606 Pain in leg, unspecified: Secondary | ICD-10-CM | POA: Diagnosis not present

## 2022-02-26 ENCOUNTER — Encounter: Payer: Self-pay | Admitting: Internal Medicine

## 2022-03-20 ENCOUNTER — Ambulatory Visit: Payer: Medicare Other | Attending: Internal Medicine | Admitting: Internal Medicine

## 2022-03-20 ENCOUNTER — Encounter: Payer: Self-pay | Admitting: Internal Medicine

## 2022-03-20 VITALS — BP 154/89 | HR 54 | Ht 68.0 in | Wt 269.8 lb

## 2022-03-20 DIAGNOSIS — I10 Essential (primary) hypertension: Secondary | ICD-10-CM | POA: Diagnosis not present

## 2022-03-20 DIAGNOSIS — D6869 Other thrombophilia: Secondary | ICD-10-CM | POA: Diagnosis not present

## 2022-03-20 DIAGNOSIS — I480 Paroxysmal atrial fibrillation: Secondary | ICD-10-CM | POA: Diagnosis not present

## 2022-03-20 DIAGNOSIS — I5022 Chronic systolic (congestive) heart failure: Secondary | ICD-10-CM | POA: Insufficient documentation

## 2022-03-20 DIAGNOSIS — R600 Localized edema: Secondary | ICD-10-CM | POA: Insufficient documentation

## 2022-03-20 DIAGNOSIS — I739 Peripheral vascular disease, unspecified: Secondary | ICD-10-CM

## 2022-03-20 DIAGNOSIS — R0609 Other forms of dyspnea: Secondary | ICD-10-CM | POA: Diagnosis not present

## 2022-03-20 MED ORDER — FUROSEMIDE 80 MG PO TABS: 80.0000 mg | ORAL_TABLET | Freq: Every day | ORAL | 1 refills | Status: DC | PRN

## 2022-03-20 NOTE — Progress Notes (Signed)
Cardiology Office Note:    Date:  03/20/2022  ID:  Amro Winebarger, DOB 04-03-1941, MRN [id number]  PCP:  [name], MD  Cardiologist:  Elouise Munroe, MD  Electrophysiologist:  None   Referring MD: [name], MD   Chief Complaint/Reason for Referral: Follow-up heart failure and atrial fibrillation  History of Present Illness:    Ronald [name] is a 81 y.o. male with a history of atrial fibrillation leading to a presumed tachycardia mediated cardiomyopathy presenting with systolic heart failure, also with a history of hypertension and morbid obesity.  He presents for follow-up in cardiology clinic.  His initial episode of atrial fibrillation began with a fall where he hurt his back.  He began to notice irregular fast heart rates on his blood pressure cuff and began to have heart failure symptoms with abdominal distention, shortness of breath, and worsening lower extremity edema.  Given heart failure symptoms, his PCP urged him to go to the emergency department where he was seen September 06, 2020 for decompensated heart failure and atrial fibrillation with rapid ventricular response.  He was diuresed and cardioversion successfully performed.  He has since been seen by heart failure impact clinic shortly after hospital dismissal and by Jory Sims, NP in follow-up. Echocardiogram 09/2020 with reduced ejection fraction 30 to 35%.  Repeat echo intended for prior to my visit today but not yet performed.  He was fortunately maintaining sinus rhythm and has continued on amiodarone and apixaban.  Previously he felt well and noted blood pressures at home of 120/60-70.  He was mostly limited by orthopedic knee pain, and continues to have some mild fatigue. Had some mild bilateral leg swelling. He was not prescribed any new medication and was instructed to continue his regimen. He was told to increase his activity level.  Last visit his blood pressure had been more controlled. He had also  received a steroid injection for right knee pain, however he felt this had made his condition worse. He continued with the same medication regimen, with a focus on increasing activity level.  Today, the patient states that he has felt average, and has no CHF symptoms. He has had no fluttering symptoms; he takes his blood pressure regularly and would have been notified by the machine of heart irregularities.  About 2-3 weeks ago he discontinued his metoprolol, and has felt that he has better endurance. He is now able to walk 150 yards versus 100 yards previously. He has taken lasix prn and avoids when he has to go places as his frequent urination is bothersome. Overall he is taking lasix about 80% of the time. He notices that compression socks don't help with his BLE edema.   In clinic today his blood pressure is 154/89, which he attributes to white coat hypertension. He states that his blood pressure is usually in the 130s at home. His valsartan has helped his blood pressure stay below 140, but recently it has been fine after stopping valsartan.  Additionally he complains of some positional dizziness, such as when he tilts his head backwards while washing his hair.  The patient complains of somewhat excessive bleeding with regular scrapes and scratches.  He has had knee pain which stops him from being very active. He recently had a knee steroid injection which did not help his pain nor worsen his cardiac condition.  He denies any palpitations, chest pain, headaches, syncope, orthopnea, or PND.   Past Medical History:  Diagnosis Date   Hypertension  Obesity (BMI 30-39.9) 06/24/2020    Past Surgical History:  Procedure Laterality Date   CARDIOVERSION N/A 09/19/2020   Procedure: CARDIOVERSION;  Surgeon: Lelon Perla, MD;  Location: [facility] ENDOSCOPY;  Service: Cardiovascular;  Laterality: N/A;   TEE WITHOUT CARDIOVERSION N/A 09/19/2020   Procedure: TRANSESOPHAGEAL ECHOCARDIOGRAM (TEE);   Surgeon: Lelon Perla, MD;  Location: [facility] ENDOSCOPY;  Service: Cardiovascular;  Laterality: N/A;   TONSILLECTOMY      Current Medications: Current Meds  Medication Sig   brimonidine-timolol (COMBIGAN) 0.2-0.5 % ophthalmic solution Place 1 drop into both eyes every 12 (twelve) hours.   cilostazol (PLETAL) 100 MG tablet Take 100 mg by mouth 2 (two) times daily.   clobetasol cream (TEMOVATE) 6.26 % Apply 1 application topically daily as needed (rash).   Multiple Vitamin (MULTIVITAMIN) tablet Take 1 tablet by mouth daily.   Multiple Vitamins-Minerals (PRESERVISION AREDS 2) CAPS Take 1 capsule by mouth in the morning and at bedtime.   Omega 3 1200 MG CAPS Take 1,200 mg by mouth daily.   spironolactone (ALDACTONE) 25 MG tablet TAKE 1/2 TABLET(12.'5MG'$      TOTAL) DAILY   vitamin B-12 (CYANOCOBALAMIN) 1000 MCG tablet Take 1,000 mcg by mouth daily.   [DISCONTINUED] amiodarone (PACERONE) 200 MG tablet TAKE 1 TABLET DAILY   [DISCONTINUED] apixaban (ELIQUIS) 5 MG TABS tablet Take 1 tablet (5 mg total) by mouth 2 (two) times daily.   [DISCONTINUED] furosemide (LASIX) 80 MG tablet TAKE 1 TABLET DAILY     Allergies:   Patient has no known allergies.   Social History   Tobacco Use   Smoking status: Former    Packs/day: 0.50    Years: 40.00    Total pack years: 20.00    Types: Cigarettes   Smokeless tobacco: Never  Substance Use Topics   Alcohol use: Yes     Family History: The patient's family history is not on file.  ROS:   Please see the history of present illness. (+) Chronic BLE Edema (+) Shortness of breath (+) Positional Dizziness (+) Easy bleeding All other systems reviewed and are negative.  EKGs/Labs/Other Studies Reviewed:    The following studies were reviewed today:  BLE Doppler 01/30/22: Although ABI values are within normal limits, monophasic waveforms in the right popliteal and posterior tibial arteries is suspicious for arterial occlusive disease. Further  evaluation with CT angiography of the lower extremity should be considered.  Echo 03/21/21: Left ventricular ejection fraction, by estimation, is 55 to 60%. The left ventricle has normal function. The left ventricle has no regional wall motion abnormalities. Left ventricular diastolic parameters are consistent with Grade I diastolic dysfunction (impaired relaxation). 1. Right ventricular systolic function is normal. The right ventricular size is normal. Tricuspid regurgitation signal is inadequate for assessing PA pressure. 2. The mitral valve is grossly normal. Trivial mitral valve regurgitation. No evidence of mitral stenosis. 3. The aortic valve is tricuspid. Aortic valve regurgitation is not visualized. No aortic stenosis is present. 4. Aortic dilatation noted. There is mild dilatation of the aortic root, measuring 41 mm. There is mild dilatation of the ascending aorta, measuring 40 mm. 5. The inferior vena cava is normal in size with greater than 50% respiratory variability, suggesting right atrial pressure of 3 mmHg.   EKG:  Is personally reviewed 03/20/22: Sinus bradycardia Rate 54, LAD, iRBBB 02/20/21: Sinus bradycardia 53, LAD, iRBBB.   Imaging studies that I have independently reviewed today: n/a  Recent Labs: 04/13/2021: ALT 19; BUN 16; Creatinine, Ser 1.14; Potassium 4.0;  Sodium 141; TSH 2.140  Recent Lipid Panel    Component Value Date/Time   CHOL 142 09/15/2020 0251   CHOL 203 05/21/2019 0000   TRIG 61 09/15/2020 0251   TRIG 113 05/21/2019 0000   HDL 58 09/15/2020 0251   HDL 62 05/21/2019 0000   CHOLHDL 2.4 09/15/2020 0251   VLDL 12 09/15/2020 0251   LDLCALC 72 09/15/2020 0251   LDLCALC 121 05/21/2019 0000    Physical Exam:    VS:  BP (!) 154/89 (BP Location: Left Arm, Patient Position: Sitting, Cuff Size: Large)   Pulse (!) 54   Ht '5\' 8"'$  (1.727 m)   Wt 269 lb 12.8 oz (122.4 kg)   SpO2 97%   BMI 41.02 kg/m     Wt Readings from Last 5 Encounters:   03/20/22 269 lb 12.8 oz (122.4 kg)  09/20/21 272 lb (123.4 kg)  09/01/21 270 lb (122.5 kg)  03/22/21 272 lb (123.4 kg)  03/21/21 267 lb (121.1 kg)    Constitutional: No acute distress Eyes: sclera non-icteric, normal conjunctiva and lids ENMT: normal dentition, moist mucous membranes Cardiovascular: regular rhythm, normal rate, No murmurs. S1 and S2 normal.  No jugular venous distention.  Respiratory: clear to auscultation bilaterally GI : normal bowel sounds, soft and nontender. No distention.   MSK: extremities warm, well perfused. 2+ BLE edema NEURO: grossly nonfocal exam, moves all extremities. PSYCH: alert and oriented x 3, normal mood and affect.   ASSESSMENT:    1. Chronic systolic congestive heart failure ([place])   2. Bilateral leg edema   3. Paroxysmal atrial fibrillation (HCC)   4. Secondary hypercoagulable state ([place])   5. Essential hypertension   6. Morbid obesity ([place])   7. Dyspnea on exertion   8. Claudication [facility])     PLAN:    Chronic systolic congestive heart failure ([place])  -echo shows recovery of EF while in SR.  Heart Failure Therapy ACE-I/ARB/ARNI: Held in the setting of low normal blood pressures and suspicion that reduced LVEF was related to A. fib RVR. Per medical record did not tolerate irbesartan during hospitalization and may not tolerate Entresto. With EF recovery, hold. BB: stopped for fatigue and exercise intolerance. MRA: Spironolactone 12.5 mg daily SGLT2I: None currently Diuretic plan: Continues on Lasix 80 mg daily, helps with LE swelling. Can take PRN NYHA class I heart failure symptoms at this time.  Claudication - will trial cilostazol per primary recommendation, counseled on increased risk of bleeding while on Eliquis.   Paroxysmal atrial fibrillation (HCC) Secondary hypercoagulable state ([place]) -has been in SR for some time, and amiodarone may be contributing to mild chronotropic incompetance sx. Stop amio now.  -Continue Eliquis 5 mg  twice daily. -metop stopped as above, in SR.    Essential hypertension -Blood pressure is currently stable and well-controlled at home on Lasix 80 mg daily, and spironolactone 12.5 mg daily.  Continue. Elevated in office today but attributes to [facility].   Morbid obesity (HCC)-discussed increasing activity level which is limited by orthopedic knee issues, now that he is not significantly limited from heart failure symptoms, he will continue to address this from a mobility standpoint and speak to his primary care physician further.   Follow up: 6 months  Total time of encounter: 30 minutes total time of encounter, including 20 minutes spent in face-to-face patient care on the date of this encounter. This time includes coordination of care and counseling regarding above mentioned problem list. Remainder of non-face-to-face time involved reviewing chart  documents/testing relevant to the patient encounter and documentation in the medical record. I have independently reviewed documentation from referring provider.   Cherlynn Kaiser, MD, [place]   Shared Decision Making/Informed Consent:       Medication Adjustments/Labs and Tests Ordered: Current medicines are reviewed at length with the patient today.  Concerns regarding medicines are outlined above.   Orders Placed This Encounter  Procedures   EKG 12-Lead    Meds ordered this encounter  Medications   furosemide (LASIX) 80 MG tablet    Sig: Take 1 tablet (80 mg total) by mouth daily as needed.    Dispense:  90 tablet    Refill:  1   Patient Instructions  Medication Instructions:  STOP Amiodarone  STOP Metoprolol Take Lasix as needed  *If you need a refill on your cardiac medications before your next appointment, please call your pharmacy*   Follow-Up: At [facility], you and your health needs are our priority.  As part of our continuing mission to provide you with exceptional heart care, we  have created designated Provider Care Teams.  These Care Teams include your primary Cardiologist (physician) and Advanced Practice Providers (APPs -  Physician Assistants and Nurse Practitioners) who all work together to provide you with the care you need, when you need it.  We recommend signing up for the patient portal called "MyChart".  Sign up information is provided on this After Visit Summary.  MyChart is used to connect with patients for Virtual Visits (Telemedicine).  Patients are able to view lab/test results, encounter notes, upcoming appointments, etc.  Non-urgent messages can be sent to your provider as well.   To learn more about what you can do with MyChart, go to [email or web address].    Your next appointment:   6 month(s)  The format for your next appointment:   In Person  Provider:   Elouise Munroe, MD          I,Coren O'Brien,acting as a scribe for Elouise Munroe, MD.,have documented all relevant documentation on the behalf of Elouise Munroe, MD,as directed by  Elouise Munroe, MD while in the presence of Elouise Munroe, MD.  I, Elouise Munroe, MD, have reviewed all documentation for the visit on 03/20/2022. The documentation on today's date of service for the exam, diagnosis, procedures, and orders are all accurate and complete.

## 2022-03-20 NOTE — Patient Instructions (Addendum)
Medication Instructions:  STOP Amiodarone  STOP Metoprolol Take Lasix as needed  *If you need a refill on your cardiac medications before your next appointment, please call your pharmacy*   Follow-Up: At [facility], you and your health needs are our priority.  As part of our continuing mission to provide you with exceptional heart care, we have created designated Provider Care Teams.  These Care Teams include your primary Cardiologist (physician) and Advanced Practice Providers (APPs -  Physician Assistants and Nurse Practitioners) who all work together to provide you with the care you need, when you need it.  We recommend signing up for the patient portal called "MyChart".  Sign up information is provided on this After Visit Summary.  MyChart is used to connect with patients for Virtual Visits (Telemedicine).  Patients are able to view lab/test results, encounter notes, upcoming appointments, etc.  Non-urgent messages can be sent to your provider as well.   To learn more about what you can do with MyChart, go to [email or web address].    Your next appointment:   6 month(s)  The format for your next appointment:   In Person  Provider:   Elouise Munroe, MD

## 2022-03-23 ENCOUNTER — Other Ambulatory Visit: Payer: Self-pay | Admitting: Internal Medicine

## 2022-03-23 DIAGNOSIS — I480 Paroxysmal atrial fibrillation: Secondary | ICD-10-CM

## 2022-03-25 DIAGNOSIS — Z23 Encounter for immunization: Secondary | ICD-10-CM | POA: Diagnosis not present

## 2022-03-27 ENCOUNTER — Encounter: Payer: Self-pay | Admitting: Internal Medicine

## 2022-03-27 DIAGNOSIS — I480 Paroxysmal atrial fibrillation: Secondary | ICD-10-CM

## 2022-03-27 MED ORDER — APIXABAN 5 MG PO TABS: 5.0000 mg | ORAL_TABLET | Freq: Two times a day (BID) | ORAL | 0 refills | Status: DC

## 2022-04-05 ENCOUNTER — Other Ambulatory Visit: Payer: Self-pay | Admitting: Pharmacist Clinician (PhC)/ Clinical Pharmacy Specialist

## 2022-04-05 DIAGNOSIS — I480 Paroxysmal atrial fibrillation: Secondary | ICD-10-CM

## 2022-04-19 DIAGNOSIS — G3184 Mild cognitive impairment, so stated: Secondary | ICD-10-CM | POA: Diagnosis not present

## 2022-04-19 DIAGNOSIS — M79606 Pain in leg, unspecified: Secondary | ICD-10-CM | POA: Diagnosis not present

## 2022-04-19 DIAGNOSIS — R000 Tachycardia, unspecified: Secondary | ICD-10-CM | POA: Diagnosis not present

## 2022-04-19 DIAGNOSIS — I480 Paroxysmal atrial fibrillation: Secondary | ICD-10-CM | POA: Diagnosis not present

## 2022-04-19 DIAGNOSIS — R001 Bradycardia, unspecified: Secondary | ICD-10-CM | POA: Diagnosis not present

## 2022-04-19 DIAGNOSIS — I5042 Chronic combined systolic (congestive) and diastolic (congestive) heart failure: Secondary | ICD-10-CM | POA: Diagnosis not present

## 2022-04-19 DIAGNOSIS — K635 Polyp of colon: Secondary | ICD-10-CM | POA: Diagnosis not present

## 2022-04-19 DIAGNOSIS — H3530 Unspecified macular degeneration: Secondary | ICD-10-CM | POA: Diagnosis not present

## 2022-04-19 DIAGNOSIS — D6869 Other thrombophilia: Secondary | ICD-10-CM | POA: Diagnosis not present

## 2022-04-19 DIAGNOSIS — I5020 Unspecified systolic (congestive) heart failure: Secondary | ICD-10-CM | POA: Diagnosis not present

## 2022-04-19 DIAGNOSIS — I10 Essential (primary) hypertension: Secondary | ICD-10-CM | POA: Diagnosis not present

## 2022-04-19 DIAGNOSIS — R4189 Other symptoms and signs involving cognitive functions and awareness: Secondary | ICD-10-CM | POA: Diagnosis not present

## 2022-04-23 NOTE — Telephone Encounter (Signed)
Please make sure the fax gets scanned into his chart - we'll need that information next time we go to refill his Eliquis.  Thanks

## 2022-04-25 NOTE — Telephone Encounter (Signed)
Results reviewed by Dr. Margaretann Loveless and placed in file to be scanned into patients chart.

## 2022-05-11 ENCOUNTER — Encounter (INDEPENDENT_AMBULATORY_CARE_PROVIDER_SITE_OTHER): Payer: Medicare Other | Admitting: Ophthalmology

## 2022-05-11 DIAGNOSIS — H43813 Vitreous degeneration, bilateral: Secondary | ICD-10-CM | POA: Diagnosis not present

## 2022-05-11 DIAGNOSIS — H353134 Nonexudative age-related macular degeneration, bilateral, advanced atrophic with subfoveal involvement: Secondary | ICD-10-CM | POA: Diagnosis not present

## 2022-05-11 DIAGNOSIS — I10 Essential (primary) hypertension: Secondary | ICD-10-CM

## 2022-05-11 DIAGNOSIS — H35033 Hypertensive retinopathy, bilateral: Secondary | ICD-10-CM

## 2022-05-31 ENCOUNTER — Other Ambulatory Visit: Payer: Self-pay | Admitting: Adult Health

## 2022-06-05 ENCOUNTER — Encounter: Payer: Self-pay | Admitting: Internal Medicine

## 2022-06-24 ENCOUNTER — Encounter: Payer: Self-pay | Admitting: Internal Medicine

## 2022-06-24 DIAGNOSIS — I480 Paroxysmal atrial fibrillation: Secondary | ICD-10-CM

## 2022-06-28 MED ORDER — APIXABAN 5 MG PO TABS: 5.0000 mg | ORAL_TABLET | Freq: Two times a day (BID) | ORAL | 1 refills | Status: DC

## 2022-06-28 NOTE — Telephone Encounter (Signed)
Prescription refill request for Eliquis received. Indication: Afib  Last office visit: 03/20/22 Margaretann Loveless)  Scr: 1.09 (04/19/22)  Age: 82 Weight: 122.4kg  Appropriate dose. Refill sent.

## 2022-09-27 ENCOUNTER — Encounter: Payer: Self-pay | Admitting: Internal Medicine

## 2022-10-01 NOTE — Progress Notes (Unsigned)
Cardiology Office Note:    Date:  10/02/2022  ID:  Kayston Jodoin, DOB 25-Oct-1940, MRN [id number]  PCP:  Theodis Shove, DO  Cardiologist:  Parke Poisson, MD  Electrophysiologist:  None   Referring MD: Theodis Shove, *   Chief Complaint/Reason for Referral: Follow-up heart failure and atrial fibrillation  History of Present Illness:    Ronald [name] is a 83 y.o. male with a history of atrial fibrillation leading to a presumed tachycardia mediated cardiomyopathy presenting with systolic heart failure, also with a history of hypertension and morbid obesity.  He presents for follow-up in cardiology clinic. His initial episode of atrial fibrillation began with a fall where he hurt his back.  He began to notice irregular fast heart rates on his blood pressure cuff and began to have heart failure symptoms with abdominal distention, shortness of breath, and worsening lower extremity edema.  Given heart failure symptoms, his PCP urged him to go to the emergency department where he was seen September 06, 2020 for decompensated heart failure and atrial fibrillation with rapid ventricular response.  He was diuresed and cardioversion successfully performed.  Today: Feels well overall and has lost about 20 pounds since our last visit.  No chest pain or shortness of breath.  No identification of atrial fibrillation on his blood pressure cuff at home which he says tracks irregular rhythms.  Stable lower extremity edema.  Only takes Lasix about 4 times a week as that is all he really needs with no significant changes in his volume weight.  Last visit: the patient states that he has felt average, and has no CHF symptoms. He has had no fluttering symptoms; he takes his blood pressure regularly and would have been notified by the machine of heart irregularities.  About 2-3 weeks ago he discontinued his metoprolol, and has felt that he has better endurance. He is now able to walk 150 yards versus  100 yards previously. He has taken lasix prn and avoids when he has to go places as his frequent urination is bothersome. Overall he is taking lasix about 80% of the time. He notices that compression socks don't help with his BLE edema.   In clinic today his blood pressure is 154/89, which he attributes to white coat hypertension. He states that his blood pressure is usually in the 130s at home. His valsartan has helped his blood pressure stay below 140, but recently it has been fine after stopping valsartan.  Additionally he complains of some positional dizziness, such as when he tilts his head backwards while washing his hair.  The patient complains of somewhat excessive bleeding with regular scrapes and scratches.  He has had knee pain which stops him from being very active. He recently had a knee steroid injection which did not help his pain nor worsen his cardiac condition.  He denies any palpitations, chest pain, headaches, syncope, orthopnea, or PND.   Past Medical History:  Diagnosis Date   Hypertension    Obesity (BMI 30-39.9) 06/24/2020    Past Surgical History:  Procedure Laterality Date   CARDIOVERSION N/A 09/19/2020   Procedure: CARDIOVERSION;  Surgeon: Lewayne Bunting, MD;  Location: [facility] ENDOSCOPY;  Service: Cardiovascular;  Laterality: N/A;   TEE WITHOUT CARDIOVERSION N/A 09/19/2020   Procedure: TRANSESOPHAGEAL ECHOCARDIOGRAM (TEE);  Surgeon: Lewayne Bunting, MD;  Location: [facility] ENDOSCOPY;  Service: Cardiovascular;  Laterality: N/A;   TONSILLECTOMY      Current Medications: Current Meds  Medication Sig   brimonidine-timolol (COMBIGAN)  0.2-0.5 % ophthalmic solution Place 1 drop into both eyes every 12 (twelve) hours.   cilostazol (PLETAL) 100 MG tablet Take 100 mg by mouth 2 (two) times daily.   clobetasol cream (TEMOVATE) 0.05 % Apply 1 application topically daily as needed (rash).   loratadine (CLARITIN) 10 MG tablet Take 10 mg by mouth daily.   Multiple Vitamin  (MULTIVITAMIN) tablet Take 1 tablet by mouth daily.   Multiple Vitamins-Minerals (PRESERVISION AREDS 2) CAPS Take 1 capsule by mouth in the morning and at bedtime.   Omega 3 1200 MG CAPS Take 1,200 mg by mouth daily.   vitamin B-12 (CYANOCOBALAMIN) 1000 MCG tablet Take 1,000 mcg by mouth daily.   [DISCONTINUED] apixaban (ELIQUIS) 5 MG TABS tablet Take 1 tablet (5 mg total) by mouth 2 (two) times daily.   [DISCONTINUED] furosemide (LASIX) 80 MG tablet Take 1 tablet (80 mg total) by mouth daily as needed.   [DISCONTINUED] spironolactone (ALDACTONE) 25 MG tablet TAKE 1/2 TABLET (12.5MG     TOTAL) DAILY     Allergies:   Patient has no known allergies.   Social History   Tobacco Use   Smoking status: Former    Packs/day: 0.50    Years: 40.00    Additional pack years: 0.00    Total pack years: 20.00    Types: Cigarettes   Smokeless tobacco: Never  Substance Use Topics   Alcohol use: Yes     Family History: The patient's family history is not on file.  ROS:   Please see the history of present illness.  All other systems reviewed and are negative.  EKGs/Labs/Other Studies Reviewed:    The following studies were reviewed today:  BLE Doppler 01/30/22: Although ABI values are within normal limits, monophasic waveforms in the right popliteal and posterior tibial arteries is suspicious for arterial occlusive disease. Further evaluation with CT angiography of the lower extremity should be considered.  Echo 03/21/21: Left ventricular ejection fraction, by estimation, is 55 to 60%. The left ventricle has normal function. The left ventricle has no regional wall motion abnormalities. Left ventricular diastolic parameters are consistent with Grade I diastolic dysfunction (impaired relaxation). 1. Right ventricular systolic function is normal. The right ventricular size is normal. Tricuspid regurgitation signal is inadequate for assessing PA pressure. 2. The mitral valve is grossly  normal. Trivial mitral valve regurgitation. No evidence of mitral stenosis. 3. The aortic valve is tricuspid. Aortic valve regurgitation is not visualized. No aortic stenosis is present. 4. Aortic dilatation noted. There is mild dilatation of the aortic root, measuring 41 mm. There is mild dilatation of the ascending aorta, measuring 40 mm. 5. The inferior vena cava is normal in size with greater than 50% respiratory variability, suggesting right atrial pressure of 3 mmHg.   EKG:  Is personally reviewed 10/02/22: NSR, RBBB, LAFB, QRS 136 ms.  03/20/22: Sinus bradycardia Rate 54, LAFB, RBBB, QRS 142 ms. 02/20/21: Sinus bradycardia 53, LAD, iRBBB.   Imaging studies that I have independently reviewed today: n/a  Recent Labs: No results found for requested labs within last 365 days.  Recent Lipid Panel    Component Value Date/Time   CHOL 142 09/15/2020 0251   CHOL 203 05/21/2019 0000   TRIG 61 09/15/2020 0251   TRIG 113 05/21/2019 0000   HDL 58 09/15/2020 0251   HDL 62 05/21/2019 0000   CHOLHDL 2.4 09/15/2020 0251   VLDL 12 09/15/2020 0251   LDLCALC 72 09/15/2020 0251   LDLCALC 121 05/21/2019 0000  Physical Exam:    VS:  BP 124/70   Pulse 73   Ht 5\' 10"  (1.778 m)   Wt 253 lb 9.6 oz (115 kg)   SpO2 96%   BMI 36.39 kg/m     Wt Readings from Last 5 Encounters:  10/02/22 253 lb 9.6 oz (115 kg)  03/20/22 269 lb 12.8 oz (122.4 kg)  09/20/21 272 lb (123.4 kg)  09/01/21 270 lb (122.5 kg)  03/22/21 272 lb (123.4 kg)    Constitutional: No acute distress Eyes: sclera non-icteric, normal conjunctiva and lids ENMT: normal dentition, moist mucous membranes Cardiovascular: regular rhythm, normal rate, No murmurs. S1 and S2 normal.  No jugular venous distention.  Respiratory: clear to auscultation bilaterally GI : normal bowel sounds, soft and nontender. No distention.   MSK: extremities warm, well perfused. 2+ BLE edema NEURO: grossly nonfocal exam, moves all  extremities. PSYCH: alert and oriented x 3, normal mood and affect.   ASSESSMENT:    1. Paroxysmal atrial fibrillation (HCC)   2. Bilateral leg edema      PLAN:    Chronic systolic congestive heart failure (HCC)  -echo shows recovery of EF while in SR.  Heart Failure Therapy ACE-I/ARB/ARNI: Held in the setting of low normal blood pressures and suspicion that reduced LVEF was related to A. fib RVR. Per medical record did not tolerate irbesartan during hospitalization and may not tolerate Entresto. With EF recovery, hold. BB: stopped for fatigue and exercise intolerance. MRA: Spironolactone 12.5 mg daily SGLT2I: None currently Diuretic plan: Continues on Lasix 80 mg daily, helps with LE swelling. Can take PRN, takes about 4 times a week. Dry weight currently 248 lb, but losing weight actively.  NYHA class I heart failure symptoms at this time.  Claudication - cilostazol per primary recommendation, counseled on increased risk of bleeding while on Eliquis.   Paroxysmal atrial fibrillation (HCC) Secondary hypercoagulable state (HCC) -has been in SR for some time, and amiodarone may be contributing to mild chronotropic incompetance sx. Amiodarone has been stopped. -Continue Eliquis 5 mg twice daily. -metop stopped as above, in SR.    Essential hypertension -Blood pressure is currently stable and well-controlled at home on Lasix 80 mg daily, and spironolactone 12.5 mg daily.  Continue.  BP grossly normal today.  Morbid obesity (HCC)-losing weight about 20 lbs since last visit.   Bifascicular block - RBBB, and LAFB, only one episode of dizziness while holding cilostazol, unclear why but overall stable.   Follow up: 6 months  Total time of encounter: 30 minutes total time of encounter, including 20 minutes spent in face-to-face patient care on the date of this encounter. This time includes coordination of care and counseling regarding above mentioned problem list. Remainder of  non-face-to-face time involved reviewing chart documents/testing relevant to the patient encounter and documentation in the medical record. I have independently reviewed documentation from referring provider.   Weston Brass, MD, [facility] [place]  [facility]   Shared Decision Making/Informed Consent:       Medication Adjustments/Labs and Tests Ordered: Current medicines are reviewed at length with the patient today.  Concerns regarding medicines are outlined above.   Orders Placed This Encounter  Procedures   EKG 12-Lead    Meds ordered this encounter  Medications   apixaban (ELIQUIS) 5 MG TABS tablet    Sig: Take 1 tablet (5 mg total) by mouth 2 (two) times daily.    Dispense:  180 tablet    Refill:  3  90 day supply   spironolactone (ALDACTONE) 25 MG tablet    Sig: TAKE 1/2 TABLET (12.5MG     TOTAL) DAILY    Dispense:  45 tablet    Refill:  3   furosemide (LASIX) 80 MG tablet    Sig: Take 1 tablet (80 mg total) by mouth daily as needed.    Dispense:  90 tablet    Refill:  3   Patient Instructions     Follow-Up: At [facility], you and your health needs are our priority.  As part of our continuing mission to provide you with exceptional heart care, we have created designated Provider Care Teams.  These Care Teams include your primary Cardiologist (physician) and Advanced Practice Providers (APPs -  Physician Assistants and Nurse Practitioners) who all work together to provide you with the care you need, when you need it.  We recommend signing up for the patient portal called "MyChart".  Sign up information is provided on this After Visit Summary.  MyChart is used to connect with patients for Virtual Visits (Telemedicine).  Patients are able to view lab/test results, encounter notes, upcoming appointments, etc.  Non-urgent messages can be sent to your provider as well.   To learn more about what you can do with MyChart, go to [email or web address].     Your next appointment:   6 month(s)  Provider:   Parke Poisson, MD

## 2022-10-02 ENCOUNTER — Ambulatory Visit: Payer: Medicare Other | Attending: Internal Medicine | Admitting: Internal Medicine

## 2022-10-02 ENCOUNTER — Encounter: Payer: Self-pay | Admitting: Internal Medicine

## 2022-10-02 DIAGNOSIS — I480 Paroxysmal atrial fibrillation: Secondary | ICD-10-CM

## 2022-10-02 DIAGNOSIS — R600 Localized edema: Secondary | ICD-10-CM | POA: Diagnosis present

## 2022-10-02 MED ORDER — SPIRONOLACTONE 25 MG PO TABS: ORAL_TABLET | ORAL | 3 refills | Status: DC

## 2022-10-02 MED ORDER — APIXABAN 5 MG PO TABS: 5.0000 mg | ORAL_TABLET | Freq: Two times a day (BID) | ORAL | 3 refills | Status: DC

## 2022-10-02 MED ORDER — FUROSEMIDE 80 MG PO TABS
80.0000 mg | ORAL_TABLET | Freq: Every day | ORAL | 3 refills | Status: DC | PRN
Start: 2022-10-02 — End: 2023-07-18

## 2022-10-02 NOTE — Patient Instructions (Signed)
    Follow-Up: At [facility], you and your health needs are our priority.  As part of our continuing mission to provide you with exceptional heart care, we have created designated Provider Care Teams.  These Care Teams include your primary Cardiologist (physician) and Advanced Practice Providers (APPs -  Physician Assistants and Nurse Practitioners) who all work together to provide you with the care you need, when you need it.  We recommend signing up for the patient portal called "MyChart".  Sign up information is provided on this After Visit Summary.  MyChart is used to connect with patients for Virtual Visits (Telemedicine).  Patients are able to view lab/test results, encounter notes, upcoming appointments, etc.  Non-urgent messages can be sent to your provider as well.   To learn more about what you can do with MyChart, go to [email or web address].    Your next appointment:   6 month(s)  Provider:   Parke Poisson, MD

## 2022-10-10 ENCOUNTER — Encounter: Payer: Self-pay | Admitting: Internal Medicine

## 2022-12-21 IMAGING — DX DG CHEST 1V PORT
1 series · 1 of 1 positions shown · non-contrast
Comparison: None.

CLINICAL DATA: Shortness of breath

EXAM:
PORTABLE CHEST 1 VIEW

[chest ap]
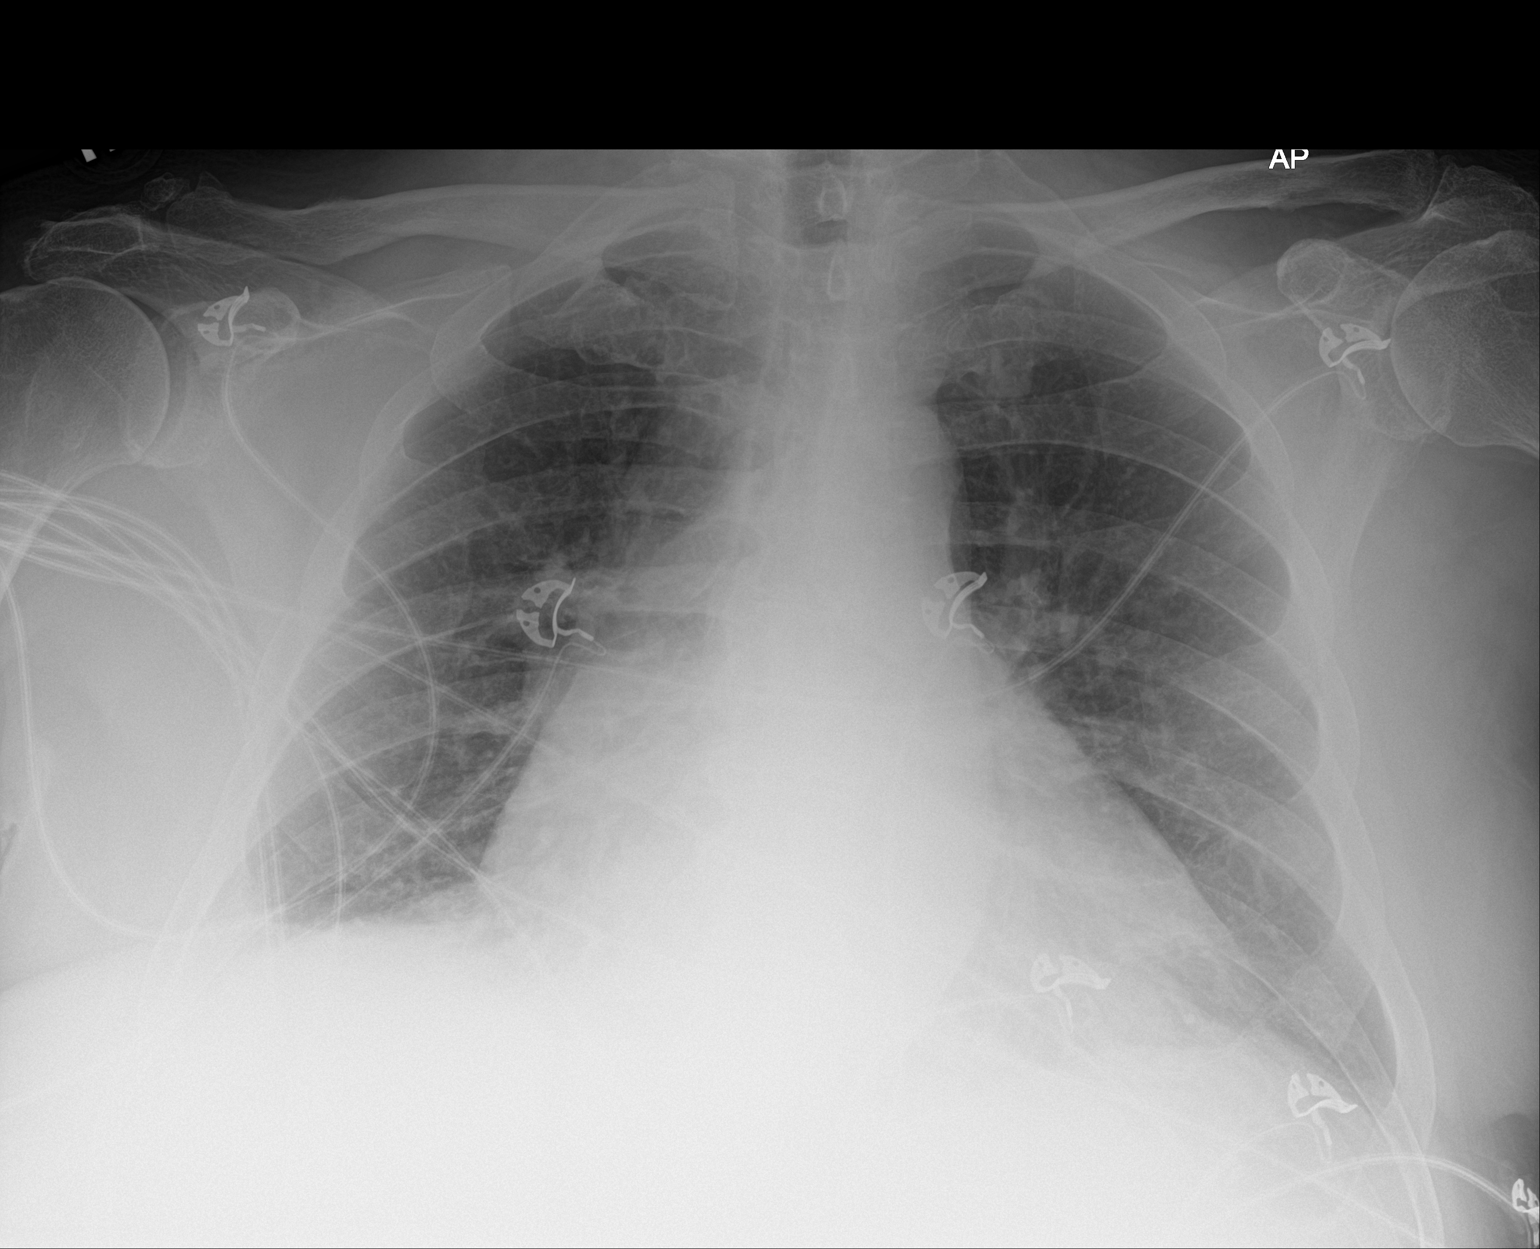

[1 of 1 positions shown; findings below may reference images not displayed]

FINDINGS: Cardiomegaly. Possible small pleural effusions. Hazy atelectasis at
the bases. No overt edema. No pneumothorax.
IMPRESSION: Cardiomegaly with possible small pleural effusions and hazy
atelectasis at the bases.

## 2023-02-24 ENCOUNTER — Encounter: Payer: Self-pay | Admitting: Internal Medicine

## 2023-05-07 ENCOUNTER — Encounter (INDEPENDENT_AMBULATORY_CARE_PROVIDER_SITE_OTHER): Payer: Medicare Other | Admitting: Ophthalmology

## 2023-05-07 DIAGNOSIS — I10 Essential (primary) hypertension: Secondary | ICD-10-CM | POA: Diagnosis not present

## 2023-05-07 DIAGNOSIS — H35033 Hypertensive retinopathy, bilateral: Secondary | ICD-10-CM | POA: Diagnosis not present

## 2023-05-07 DIAGNOSIS — H353134 Nonexudative age-related macular degeneration, bilateral, advanced atrophic with subfoveal involvement: Secondary | ICD-10-CM

## 2023-05-07 DIAGNOSIS — H43813 Vitreous degeneration, bilateral: Secondary | ICD-10-CM

## 2023-07-16 ENCOUNTER — Encounter: Payer: Self-pay | Admitting: Internal Medicine

## 2023-07-16 DIAGNOSIS — R600 Localized edema: Secondary | ICD-10-CM

## 2023-07-16 DIAGNOSIS — I480 Paroxysmal atrial fibrillation: Secondary | ICD-10-CM

## 2023-07-18 MED ORDER — APIXABAN 5 MG PO TABS
5.0000 mg | ORAL_TABLET | Freq: Two times a day (BID) | ORAL | 0 refills | Status: AC
Start: 2023-07-18 — End: ?

## 2023-07-18 MED ORDER — FUROSEMIDE 80 MG PO TABS
80.0000 mg | ORAL_TABLET | Freq: Every day | ORAL | 0 refills | Status: DC | PRN
Start: 2023-07-18 — End: 2023-07-22

## 2023-07-18 MED ORDER — SPIRONOLACTONE 25 MG PO TABS: ORAL_TABLET | ORAL | 0 refills | Status: DC

## 2023-07-22 MED ORDER — FUROSEMIDE 80 MG PO TABS
80.0000 mg | ORAL_TABLET | Freq: Every day | ORAL | 0 refills | Status: DC | PRN
Start: 2023-07-22 — End: 2023-09-13

## 2023-07-22 MED ORDER — SPIRONOLACTONE 25 MG PO TABS: ORAL_TABLET | ORAL | 0 refills | Status: DC

## 2023-07-22 MED ORDER — APIXABAN 5 MG PO TABS
5.0000 mg | ORAL_TABLET | Freq: Two times a day (BID) | ORAL | 0 refills | Status: DC
Start: 2023-07-22 — End: 2023-09-16

## 2023-07-22 NOTE — Addendum Note (Signed)
 Addended by: Bernita Buffy on: 07/22/2023 11:53 AM   Modules accepted: Orders

## 2023-08-08 ENCOUNTER — Ambulatory Visit: Payer: Medicare Other | Admitting: General Practice

## 2023-08-29 ENCOUNTER — Telehealth: Payer: Self-pay | Admitting: *Deleted

## 2023-08-29 ENCOUNTER — Ambulatory Visit: Payer: Medicare Other | Attending: Internal Medicine | Admitting: Internal Medicine

## 2023-08-29 ENCOUNTER — Encounter: Payer: Self-pay | Admitting: Internal Medicine

## 2023-08-29 VITALS — BP 128/70 | HR 68 | Ht 70.0 in | Wt 244.0 lb

## 2023-08-29 DIAGNOSIS — I110 Hypertensive heart disease with heart failure: Secondary | ICD-10-CM | POA: Diagnosis not present

## 2023-08-29 DIAGNOSIS — D6869 Other thrombophilia: Secondary | ICD-10-CM | POA: Diagnosis present

## 2023-08-29 DIAGNOSIS — R0609 Other forms of dyspnea: Secondary | ICD-10-CM | POA: Diagnosis present

## 2023-08-29 DIAGNOSIS — R600 Localized edema: Secondary | ICD-10-CM

## 2023-08-29 DIAGNOSIS — I739 Peripheral vascular disease, unspecified: Secondary | ICD-10-CM | POA: Diagnosis not present

## 2023-08-29 DIAGNOSIS — I5022 Chronic systolic (congestive) heart failure: Secondary | ICD-10-CM | POA: Diagnosis not present

## 2023-08-29 DIAGNOSIS — I10 Essential (primary) hypertension: Secondary | ICD-10-CM

## 2023-08-29 DIAGNOSIS — E669 Obesity, unspecified: Secondary | ICD-10-CM

## 2023-08-29 DIAGNOSIS — I480 Paroxysmal atrial fibrillation: Secondary | ICD-10-CM | POA: Diagnosis not present

## 2023-08-29 NOTE — Patient Instructions (Addendum)
 Medication Instructions:  No Changes  Lab Work: None  Testing/Procedures: Please have your PCP's office get an EKG (Electrocardiogram) at your June 2025 visit.  This test checks the electrical activity of the heart; it is non invasive and can easily be done at a Primary Care Provider's office.  Please ask them to send it to [place] electronically in Epic or fax it to [phone].    Follow-Up: At [facility], you and your health needs are our priority.  As part of our continuing mission to provide you with exceptional heart care, we have created designated Provider Care Teams.  These Care Teams include your primary Cardiologist (physician) and Advanced Practice Providers (APPs -  Physician Assistants and Nurse Practitioners) who all work together to provide you with the care you need, when you need it.    Your next appointment:   1 year(s) (Please call in January 2026 to schedule an appointment for March 2026)  Provider:   Parke Poisson, MD

## 2023-08-29 NOTE — Telephone Encounter (Signed)
  Patient Consent for Virtual Visit        Ronald [name] has provided verbal consent on 08/29/2023 for a virtual visit (video or telephone).   CONSENT FOR VIRTUAL VISIT FOR:  Ronald [name]  By participating in this virtual visit I agree to the following:  I hereby voluntarily request, consent and authorize [facility] and its employed or contracted physicians, physician assistants, nurse practitioners or other licensed health care professionals (the Practitioner), to provide me with telemedicine health care services (the "Services") as deemed necessary by the treating Practitioner. I acknowledge and consent to receive the Services by the Practitioner via telemedicine. I understand that the telemedicine visit will involve communicating with the Practitioner through live audiovisual communication technology and the disclosure of certain medical information by electronic transmission. I acknowledge that I have been given the opportunity to request an in-person assessment or other available alternative prior to the telemedicine visit and am voluntarily participating in the telemedicine visit.  I understand that I have the right to withhold or withdraw my consent to the use of telemedicine in the course of my care at any time, without affecting my right to future care or treatment, and that the Practitioner or I may terminate the telemedicine visit at any time. I understand that I have the right to inspect all information obtained and/or recorded in the course of the telemedicine visit and may receive copies of available information for a reasonable fee.  I understand that some of the potential risks of receiving the Services via telemedicine include:  Delay or interruption in medical evaluation due to technological equipment failure or disruption; Information transmitted may not be sufficient (e.g. poor resolution of images) to allow for appropriate medical decision making by the  Practitioner; and/or  In rare instances, security protocols could fail, causing a breach of personal health information.  Furthermore, I acknowledge that it is my responsibility to provide information about my medical history, conditions and care that is complete and accurate to the best of my ability. I acknowledge that Practitioner's advice, recommendations, and/or decision may be based on factors not within their control, such as incomplete or inaccurate data provided by me or distortions of diagnostic images or specimens that may result from electronic transmissions. I understand that the practice of medicine is not an exact science and that Practitioner makes no warranties or guarantees regarding treatment outcomes. I acknowledge that a copy of this consent can be made available to me via my patient portal [facility] MyChart), or I can request a printed copy by calling the office of [facility].    I understand that my insurance will be billed for this visit.   I have read or had this consent read to me. I understand the contents of this consent, which adequately explains the benefits and risks of the Services being provided via telemedicine.  I have been provided ample opportunity to ask questions regarding this consent and the Services and have had my questions answered to my satisfaction. I give my informed consent for the services to be provided through the use of telemedicine in my medical care

## 2023-08-29 NOTE — Progress Notes (Addendum)
 Cardiology Office Note:    Date:  08/29/2023  ID:  Ronald [name], DOB July 15, 1940, MRN [id number]  PCP:  Augustus Blood, DO  Cardiologist:  Euell Herrlich, MD  Electrophysiologist:  None   Referring MD: Augustus Blood, *   Chief Complaint/Reason for Referral: Follow-up heart failure and atrial fibrillation  History of Present Illness:    Ronald [name] is a 83 y.o. male with a history of atrial fibrillation leading to a presumed tachycardia mediated cardiomyopathy presenting with systolic heart failure, also with a history of hypertension and morbid obesity.    Virtual Visit via Video Note   Because of Ronald [name] co-morbid illnesses, he is at least at moderate risk for complications without adequate follow up.  This format is felt to be most appropriate for this patient at this time.  All issues noted in this document were discussed and addressed.  A limited physical exam was performed with this format.  Please refer to the patient's chart for his consent to telehealth for Ronald [name].  Patient location: home Physician location: office   The patient was identified using 2 identifiers.   08/29/23: overall feeling well with no CHF. Limited mobility due to orthopedic issues. No concerns about current medications, reviewed in detail. ------------------------------------------------------------------------- Prior: Feels well overall and has lost about 20 pounds since our last visit.  No chest pain or shortness of breath.  No identification of atrial fibrillation on his blood pressure cuff at home which he says tracks irregular rhythms.  Stable lower extremity edema.  Only takes Lasix about 4 times a week as that is all he really needs with no significant changes in his volume weight.  Last visit: the patient states that he has felt average, and has no CHF symptoms. He has had no fluttering symptoms; he takes his blood pressure regularly and would have  been notified by the machine of heart irregularities.  About 2-3 weeks ago he discontinued his metoprolol, and has felt that he has better endurance. He is now able to walk 150 yards versus 100 yards previously. He has taken lasix prn and avoids when he has to go places as his frequent urination is bothersome. Overall he is taking lasix about 80% of the time. He notices that compression socks don't help with his BLE edema.   In clinic today his blood pressure is 154/89, which he attributes to white coat hypertension. He states that his blood pressure is usually in the 130s at home. His valsartan has helped his blood pressure stay below 140, but recently it has been fine after stopping valsartan.  Additionally he complains of some positional dizziness, such as when he tilts his head backwards while washing his hair.  The patient complains of somewhat excessive bleeding with regular scrapes and scratches.  He has had knee pain which stops him from being very active. He recently had a knee steroid injection which did not help his pain nor worsen his cardiac condition.  He denies any palpitations, chest pain, headaches, syncope, orthopnea, or PND.   Past Medical History:  Diagnosis Date   Hypertension    Obesity (BMI 30-39.9) 06/24/2020    Past Surgical History:  Procedure Laterality Date   CARDIOVERSION N/A 09/19/2020   Procedure: CARDIOVERSION;  Surgeon: Lenise Quince, MD;  Location: [facility] ENDOSCOPY;  Service: Cardiovascular;  Laterality: N/A;   TEE WITHOUT CARDIOVERSION N/A 09/19/2020   Procedure: TRANSESOPHAGEAL ECHOCARDIOGRAM (TEE);  Surgeon: Lenise Quince, MD;  Location: [facility] ENDOSCOPY;  Service: Cardiovascular;  Laterality: N/A;   TONSILLECTOMY      Current Medications: No outpatient medications have been marked as taking for the 08/29/23 encounter (Video Visit) with Katrese Shell A, MD.     Allergies:   Patient has no known allergies.   Social History   Tobacco Use    Smoking status: Former    Current packs/day: 0.50    Average packs/day: 0.5 packs/day for 40.0 years (20.0 ttl pk-yrs)    Types: Cigarettes   Smokeless tobacco: Never  Substance Use Topics   Alcohol use: Yes     Family History: The patient's family history is not on file.  ROS:   Please see the history of present illness.  All other systems reviewed and are negative.  EKGs/Labs/Other Studies Reviewed:    The following studies were reviewed today:  BLE Doppler 01/30/22: Although ABI values are within normal limits, monophasic waveforms in the right popliteal and posterior tibial arteries is suspicious for arterial occlusive disease. Further evaluation with CT angiography of the lower extremity should be considered.  Echo 03/21/21: Left ventricular ejection fraction, by estimation, is 55 to 60%. The left ventricle has normal function. The left ventricle has no regional wall motion abnormalities. Left ventricular diastolic parameters are consistent with Grade I diastolic dysfunction (impaired relaxation). 1. Right ventricular systolic function is normal. The right ventricular size is normal. Tricuspid regurgitation signal is inadequate for assessing PA pressure. 2. The mitral valve is grossly normal. Trivial mitral valve regurgitation. No evidence of mitral stenosis. 3. The aortic valve is tricuspid. Aortic valve regurgitation is not visualized. No aortic stenosis is present. 4. Aortic dilatation noted. There is mild dilatation of the aortic root, measuring 41 mm. There is mild dilatation of the ascending aorta, measuring 40 mm. 5. The inferior vena cava is normal in size with greater than 50% respiratory variability, suggesting right atrial pressure of 3 mmHg.   EKG:  Is personally reviewed 10/02/22: NSR, RBBB, LAFB, QRS 136 ms.  03/20/22: Sinus bradycardia Rate 54, LAFB, RBBB, QRS 142 ms. 02/20/21: Sinus bradycardia 53, LAD, iRBBB.   Imaging studies that I have  independently reviewed today: n/a  Recent Labs: No results found for requested labs within last 365 days.  Recent Lipid Panel    Component Value Date/Time   CHOL 142 09/15/2020 0251   CHOL 203 05/21/2019 0000   TRIG 61 09/15/2020 0251   TRIG 113 05/21/2019 0000   HDL 58 09/15/2020 0251   HDL 62 05/21/2019 0000   CHOLHDL 2.4 09/15/2020 0251   VLDL 12 09/15/2020 0251   LDLCALC 72 09/15/2020 0251   LDLCALC 121 05/21/2019 0000    Physical Exam:    VS:  BP 128/70 (BP Location: Left Arm, Patient Position: Sitting, Cuff Size: Large)   Pulse 68   Ht 5\' 10"  (1.778 m)   Wt 244 lb (110.7 kg)   SpO2 94%   BMI 35.01 kg/m     Wt Readings from Last 5 Encounters:  08/29/23 244 lb (110.7 kg)  10/02/22 253 lb 9.6 oz (115 kg)  03/20/22 269 lb 12.8 oz (122.4 kg)  09/20/21 272 lb (123.4 kg)  09/01/21 270 lb (122.5 kg)    VITAL SIGNS:  reviewed GEN:  no acute distress RESPIRATORY:  normal respiratory effort, no increased work of breathing NEURO:  alert and oriented x 3, speech normal PSYCH:  normal affect   ASSESSMENT:    PLAN:    Chronic systolic congestive heart failure (HCC)  -echo shows  recovery of EF while in SR.  Heart Failure Therapy ACE-I/ARB/ARNI: Held in the setting of low normal blood pressures and suspicion that reduced LVEF was related to A. fib RVR. Per medical record did not tolerate irbesartan during hospitalization and may not tolerate Entresto. With EF recovery, hold. BB: stopped for fatigue and exercise intolerance. MRA: Spironolactone 12.5 mg daily SGLT2I: None currently Diuretic plan: Continues on Lasix 80 mg daily, helps with LE swelling. Can take PRN, takes about 4 times a week. Dry weight currently 244 lb NYHA class I heart failure symptoms at this time.  Claudication - cilostazol per primary recommendation, counseled on increased risk of bleeding while on Eliquis.   Paroxysmal atrial fibrillation (HCC) Secondary hypercoagulable state (HCC) -has been in  SR for some time, and amiodarone may have been contributing to mild chronotropic incompetance sx. Amiodarone has been stopped. -Continue Eliquis 5 mg twice daily.   Essential hypertension -Blood pressure is currently stable and well-controlled at home on Lasix 80 mg daily, and spironolactone 12.5 mg daily.  Continue.  BP normal today.   Bifascicular block - RBBB, and LAFB on last EKG, no updated EKG.  No dizziness, advised to present to office if change in symptoms for EKG and in person visit.   Follow up:  1 year  Grady Lawman, MD, [facility]  [facility]   Shared Decision Making/Informed Consent:       Medication Adjustments/Labs and Tests Ordered: Current medicines are reviewed at length with the patient today.  Concerns regarding medicines are outlined above.   No orders of the defined types were placed in this encounter.   No orders of the defined types were placed in this encounter.  Patient Instructions  Medication Instructions:  No Changes  Lab Work: None  Testing/Procedures: Please have your PCP's office get an EKG (Electrocardiogram) at your June 2025 visit.  This test checks the electrical activity of the heart; it is non invasive and can easily be done at a Primary Care Provider's office.  Please ask them to send it to us  electronically in Epic or fax it to [phone].    Follow-Up: At [facility], you and your health needs are our priority.  As part of our continuing mission to provide you with exceptional heart care, we have created designated Provider Care Teams.  These Care Teams include your primary Cardiologist (physician) and Advanced Practice Providers (APPs -  Physician Assistants and Nurse Practitioners) who all work together to provide you with the care you need, when you need it.    Your next appointment:   1 year(s) (Please call in January 2026 to schedule an appointment for March 2026)  Provider:   Monasia Lair A Augusten Lipkin, MD

## 2023-09-09 NOTE — Addendum Note (Signed)
 Addended by: Weston Brass A on: 09/09/2023 02:35 PM   Modules accepted: Level of Service

## 2023-09-13 MED ORDER — SPIRONOLACTONE 25 MG PO TABS: 12.5000 mg | ORAL_TABLET | Freq: Every day | ORAL | 3 refills | Status: DC

## 2023-09-13 MED ORDER — FUROSEMIDE 80 MG PO TABS
80.0000 mg | ORAL_TABLET | Freq: Every day | ORAL | 3 refills | Status: DC
Start: 2023-09-13 — End: 2023-09-19

## 2023-09-13 NOTE — Addendum Note (Signed)
 Addended by: Marilynn Rail on: 09/13/2023 06:04 PM   Modules accepted: Orders

## 2023-09-16 ENCOUNTER — Telehealth: Payer: Self-pay | Admitting: Licensed Clinical Social Worker

## 2023-09-16 ENCOUNTER — Other Ambulatory Visit: Payer: Self-pay

## 2023-09-16 DIAGNOSIS — I480 Paroxysmal atrial fibrillation: Secondary | ICD-10-CM

## 2023-09-16 MED ORDER — APIXABAN 5 MG PO TABS
5.0000 mg | ORAL_TABLET | Freq: Two times a day (BID) | ORAL | 0 refills | Status: DC
Start: 2023-09-16 — End: 2023-11-29

## 2023-09-16 NOTE — Telephone Encounter (Signed)
 H&V Care Navigation CSW Progress Note  Clinical Social Worker contacted patient by phone to f/u on information regarding PCP clinics. Was able to reach him at [phone]. Introduced self, role, reason for call. Pt confirmed he is in need of information about new PCPs as the clinic he goes to is closing. Pt agreeable to resources being sent to him- if any additional assistance needed to schedule appt- encouraged him to reach out to me, will f/u to ensure he received list. No additional questions from pt at this time.  Patient is participating in a Managed Medicaid Plan:  No, Medicare and supplement  SDOH Screenings   Food Insecurity: No Food Insecurity (09/01/2021)  Housing: Low Risk  (09/01/2021)  Transportation Needs: Unmet Transportation Needs (09/14/2021)  Alcohol Screen: Low Risk  (09/01/2021)  Depression (PHQ2-9): Low Risk  (09/01/2021)  Financial Resource Strain: Low Risk  (09/01/2021)  Physical Activity: Inactive (09/01/2021)  Stress: No Stress Concern Present (09/01/2021)  Tobacco Use: Medium Risk (08/29/2023)    Nathen Balder, MSW, LCSW Clinical Social Worker II [facility] Heart/Vascular Care Navigation  [phone]- work cell phone (preferred) [phone]- desk phone

## 2023-09-16 NOTE — Addendum Note (Signed)
 Addended by: Arthella Headings C on: 09/16/2023 10:24 AM   Modules accepted: Orders

## 2023-09-16 NOTE — Telephone Encounter (Signed)
 Prescription refill request for Eliquis received. Indication:afib Last office visit:3/25 ZOX:WRUEA labs Age: 83 Weight:110.7  kg  Prescription refilled

## 2023-09-18 ENCOUNTER — Encounter: Payer: Self-pay | Admitting: Family Medicine

## 2023-09-18 NOTE — Telephone Encounter (Signed)
 Left message to call back

## 2023-09-19 ENCOUNTER — Emergency Department (HOSPITAL_COMMUNITY)

## 2023-09-19 ENCOUNTER — Observation Stay (HOSPITAL_COMMUNITY)
Admission: EM | Admit: 2023-09-19 | Discharge: 2023-09-20 | Disposition: A | Discharge status: Home / Self Care | Attending: Student in an Organized Health Care Education/Training Program | Admitting: Student in an Organized Health Care Education/Training Program

## 2023-09-19 ENCOUNTER — Encounter (HOSPITAL_COMMUNITY): Payer: Self-pay

## 2023-09-19 ENCOUNTER — Other Ambulatory Visit: Payer: Self-pay

## 2023-09-19 DIAGNOSIS — R531 Weakness: Principal | ICD-10-CM

## 2023-09-19 DIAGNOSIS — E861 Hypovolemia: Secondary | ICD-10-CM | POA: Diagnosis not present

## 2023-09-19 DIAGNOSIS — I959 Hypotension, unspecified: Secondary | ICD-10-CM | POA: Diagnosis not present

## 2023-09-19 DIAGNOSIS — I480 Paroxysmal atrial fibrillation: Secondary | ICD-10-CM | POA: Insufficient documentation

## 2023-09-19 DIAGNOSIS — R55 Syncope and collapse: Secondary | ICD-10-CM | POA: Diagnosis not present

## 2023-09-19 DIAGNOSIS — I110 Hypertensive heart disease with heart failure: Secondary | ICD-10-CM | POA: Insufficient documentation

## 2023-09-19 DIAGNOSIS — I5032 Chronic diastolic (congestive) heart failure: Secondary | ICD-10-CM | POA: Diagnosis not present

## 2023-09-19 DIAGNOSIS — R42 Dizziness and giddiness: Principal | ICD-10-CM | POA: Insufficient documentation

## 2023-09-19 DIAGNOSIS — Z79899 Other long term (current) drug therapy: Secondary | ICD-10-CM | POA: Diagnosis not present

## 2023-09-19 LAB — CBC WITH DIFFERENTIAL/PLATELET
Abs Immature Granulocytes: 0.02 10*3/uL (ref 0.00–0.07)
Basophils Absolute: 0.1 10*3/uL (ref 0.0–0.1)
Basophils Relative: 1 %
Eosinophils Absolute: 0 10*3/uL (ref 0.0–0.5)
Eosinophils Relative: 0 %
HCT: 37.4 % — ABNORMAL LOW (ref 39.0–52.0)
Hemoglobin: 12.8 g/dL — ABNORMAL LOW (ref 13.0–17.0)
Immature Granulocytes: 0 %
Lymphocytes Relative: 12 %
Lymphs Abs: 1.1 10*3/uL (ref 0.7–4.0)
MCH: 32.3 pg (ref 26.0–34.0)
MCHC: 34.2 g/dL (ref 30.0–36.0)
MCV: 94.4 fL (ref 80.0–100.0)
Monocytes Absolute: 0.8 10*3/uL (ref 0.1–1.0)
Monocytes Relative: 9 %
Neutro Abs: 6.9 10*3/uL (ref 1.7–7.7)
Neutrophils Relative %: 78 %
Platelets: 153 10*3/uL (ref 150–400)
RBC: 3.96 MIL/uL — ABNORMAL LOW (ref 4.22–5.81)
RDW: 13.8 % (ref 11.5–15.5)
WBC: 8.8 10*3/uL (ref 4.0–10.5)
nRBC: 0 % (ref 0.0–0.2)

## 2023-09-19 LAB — TSH: TSH: 0.554 u[IU]/mL (ref 0.350–4.500)

## 2023-09-19 LAB — URINALYSIS, ROUTINE W REFLEX MICROSCOPIC
Bilirubin Urine: NEGATIVE
Glucose, UA: NEGATIVE mg/dL
Hgb urine dipstick: NEGATIVE
Ketones, ur: NEGATIVE mg/dL
Leukocytes,Ua: NEGATIVE
Nitrite: NEGATIVE
Protein, ur: NEGATIVE mg/dL
Specific Gravity, Urine: 1.025 (ref 1.005–1.030)
pH: 5 (ref 5.0–8.0)

## 2023-09-19 LAB — TROPONIN I (HIGH SENSITIVITY)
Troponin I (High Sensitivity): 13 ng/L (ref <18)
Troponin I (High Sensitivity): 15 ng/L (ref <18)

## 2023-09-19 LAB — COMPREHENSIVE METABOLIC PANEL WITH GFR
ALT: 12 U/L (ref 0–44)
AST: 17 U/L (ref 15–41)
Albumin: 3 g/dL — ABNORMAL LOW (ref 3.5–5.0)
Alkaline Phosphatase: 60 U/L (ref 38–126)
Anion gap: 9 (ref 5–15)
BUN: 14 mg/dL (ref 8–23)
CO2: 27 mmol/L (ref 22–32)
Calcium: 8.9 mg/dL (ref 8.9–10.3)
Chloride: 104 mmol/L (ref 98–111)
Creatinine, Ser: 1.23 mg/dL (ref 0.61–1.24)
GFR, Estimated: 59 mL/min — ABNORMAL LOW (ref >60)
Glucose, Bld: 126 mg/dL — ABNORMAL HIGH (ref 70–99)
Potassium: 3.2 mmol/L — ABNORMAL LOW (ref 3.5–5.1)
Sodium: 140 mmol/L (ref 135–145)
Total Bilirubin: 1.2 mg/dL (ref 0.0–1.2)
Total Protein: 5.8 g/dL — ABNORMAL LOW (ref 6.5–8.1)

## 2023-09-19 LAB — LACTIC ACID, PLASMA
Lactic Acid, Venous: 1.1 mmol/L (ref 0.5–1.9)
Lactic Acid, Venous: 1.4 mmol/L (ref 0.5–1.9)

## 2023-09-19 LAB — I-STAT CG4 LACTIC ACID, ED
Lactic Acid, Venous: 1 mmol/L (ref 0.5–1.9)
Lactic Acid, Venous: 0.8 mmol/L (ref 0.5–1.9)

## 2023-09-19 LAB — BRAIN NATRIURETIC PEPTIDE: B Natriuretic Peptide: 137.2 pg/mL — ABNORMAL HIGH (ref 0.0–100.0)

## 2023-09-19 MED ORDER — SODIUM CHLORIDE 0.9 % IV BOLUS
1000.0000 mL | Freq: Once | INTRAVENOUS | Status: AC
  Administered 2023-09-19: 1000 mL via INTRAVENOUS

## 2023-09-19 MED ORDER — LORATADINE 10 MG PO TABS: 10.0000 mg | ORAL_TABLET | Freq: Every day | ORAL | Status: DC | PRN

## 2023-09-19 MED ORDER — SPIRONOLACTONE 25 MG PO TABS: 12.5000 mg | ORAL_TABLET | Freq: Every day | ORAL | 3 refills | Status: DC

## 2023-09-19 MED ORDER — BRIMONIDINE TARTRATE-TIMOLOL 0.2-0.5 % OP SOLN
1.0000 [drp] | Freq: Two times a day (BID) | OPHTHALMIC | Status: DC
  Filled 2023-09-19: qty 5

## 2023-09-19 MED ORDER — SODIUM CHLORIDE 0.9 % IV BOLUS
500.0000 mL | Freq: Once | INTRAVENOUS | Status: AC
  Administered 2023-09-19: 500 mL via INTRAVENOUS

## 2023-09-19 MED ORDER — CILOSTAZOL 100 MG PO TABS
100.0000 mg | ORAL_TABLET | Freq: Two times a day (BID) | ORAL | Status: DC
  Filled 2023-09-19 (×2): qty 1

## 2023-09-19 MED ORDER — ONDANSETRON HCL 4 MG/2ML IJ SOLN: 4.0000 mg | Freq: Three times a day (TID) | INTRAMUSCULAR | Status: DC | PRN

## 2023-09-19 MED ORDER — APIXABAN 5 MG PO TABS
5.0000 mg | ORAL_TABLET | Freq: Two times a day (BID) | ORAL | Status: DC
  Administered 2023-09-19 – 2023-09-20 (×3): 5 mg via ORAL
  Filled 2023-09-19 (×3): qty 1

## 2023-09-19 MED ORDER — LACTATED RINGERS IV BOLUS
1000.0000 mL | Freq: Once | INTRAVENOUS | Status: AC
  Administered 2023-09-19: 1000 mL via INTRAVENOUS

## 2023-09-19 MED ORDER — FUROSEMIDE 80 MG PO TABS: 80.0000 mg | ORAL_TABLET | Freq: Every day | ORAL | 3 refills | Status: DC

## 2023-09-19 MED ORDER — LATANOPROST 0.005 % OP SOLN
1.0000 [drp] | Freq: Every day | OPHTHALMIC | Status: DC
  Administered 2023-09-19: 1 [drp] via OPHTHALMIC
  Filled 2023-09-19: qty 2.5

## 2023-09-19 MED ORDER — POTASSIUM CHLORIDE CRYS ER 20 MEQ PO TBCR
40.0000 meq | EXTENDED_RELEASE_TABLET | Freq: Once | ORAL | Status: AC
  Administered 2023-09-19: 40 meq via ORAL
  Filled 2023-09-19: qty 2

## 2023-09-19 NOTE — ED Triage Notes (Signed)
 Pt bibgcems from home pt had weakness getting into bed. Pt rolled out of bed onto floor. No injuries from fall, no loc, did not hit head. Pt c/o dizziness. Pt on eliquis Bp 108/70 Hr 0-90 96% ra 148 cbg

## 2023-09-19 NOTE — ED Provider Notes (Signed)
 [facility] AT [facility] Provider Note   CSN: [id number] Arrival date & time: 09/19/23  0347     History  Chief Complaint  Patient presents with   Ronald [name] is a 83 y.o. male.  Presents to the emergency department by ambulance from home for evaluation of fall.  Patient arrives as a level 2 as he does take Eliquis chronically.  Patient reports that he woke up and tried to get out of bed to go to the bathroom, both of his legs gave out and he fell to the ground.  He does not think he hit his head, no loss of consciousness.  Patient reports that his extremities are extremely weak.  Has headache, neck pain, back pain.       Home Medications Prior to Admission medications   Medication Sig Start Date End Date Taking? Authorizing Provider  apixaban (ELIQUIS) 5 MG TABS tablet Take 1 tablet (5 mg total) by mouth 2 (two) times daily. NEEDS LABS FOR ELIQUIS REFILLS, COME TO OFFICE.  THANK YOU 09/16/23   Parke Poisson, MD  brimonidine-timolol (COMBIGAN) 0.2-0.5 % ophthalmic solution Place 1 drop into both eyes every 12 (twelve) hours.    [provider]  cilostazol (PLETAL) 100 MG tablet Take 100 mg by mouth 2 (two) times daily. 03/17/22   [provider]  clobetasol cream (TEMOVATE) 0.05 % Apply 1 application topically daily as needed (rash).    [provider]  furosemide (LASIX) 80 MG tablet Take 1 tablet (80 mg total) by mouth daily. 09/13/23   Parke Poisson, MD  loratadine (CLARITIN) 10 MG tablet Take 10 mg by mouth daily.    [provider]  Multiple Vitamin (MULTIVITAMIN) tablet Take 1 tablet by mouth daily.    [provider]  Multiple Vitamins-Minerals (PRESERVISION AREDS 2) CAPS Take 1 capsule by mouth in the morning and at bedtime.    [provider]  Omega 3 1200 MG CAPS Take 1,200 mg by mouth daily.    [provider]  spironolactone (ALDACTONE) 25 MG tablet Take  0.5 tablets (12.5 mg total) by mouth daily. 09/13/23   Parke Poisson, MD  vitamin B-12 (CYANOCOBALAMIN) 1000 MCG tablet Take 1,000 mcg by mouth daily.    [provider]      Allergies    Patient has no known allergies.    Review of Systems   Review of Systems  Physical Exam Updated Vital Signs BP (!) 85/61   Pulse 70   Temp 98 F (36.7 C)   Resp 19   Ht 5\' 10"  (1.778 m)   Wt 111.1 kg   SpO2 97%   BMI 35.15 kg/m  Physical Exam Vitals and nursing note reviewed.  Constitutional:      General: He is not in acute distress.    Appearance: He is well-developed.  HENT:     Head: Normocephalic and atraumatic.     Mouth/Throat:     Mouth: Mucous membranes are moist.  Eyes:     General: Vision grossly intact. Gaze aligned appropriately.     Extraocular Movements: Extraocular movements intact.     Conjunctiva/sclera: Conjunctivae normal.  Cardiovascular:     Rate and Rhythm: Normal rate and regular rhythm.     Pulses: Normal pulses.     Heart sounds: Normal heart sounds, S1 normal and S2 normal. No murmur heard.    No friction rub. No gallop.  Pulmonary:  Effort: Pulmonary effort is normal. No respiratory distress.     Breath sounds: Normal breath sounds.  Abdominal:     Palpations: Abdomen is soft.     Tenderness: There is no abdominal tenderness. There is no guarding or rebound.     Hernia: No hernia is present.  Musculoskeletal:        General: No swelling.     Cervical back: Full passive range of motion without pain, normal range of motion and neck supple. No pain with movement, spinous process tenderness or muscular tenderness. Normal range of motion.     Right lower leg: Edema present.     Left lower leg: Edema present.  Skin:    General: Skin is warm and dry.     Capillary Refill: Capillary refill takes less than 2 seconds.     Findings: No ecchymosis, erythema, lesion or wound.  Neurological:     Mental Status: He is alert and oriented to person,  place, and time.     GCS: GCS eye subscore is 4. GCS verbal subscore is 5. GCS motor subscore is 6.     Cranial Nerves: Cranial nerves 2-12 are intact.     Sensory: Sensation is intact.     Motor: Motor function is intact. No weakness or abnormal muscle tone.     Coordination: Coordination is intact.  Psychiatric:        Mood and Affect: Mood normal.        Speech: Speech normal.        Behavior: Behavior normal.     ED Results / Procedures / Treatments   Labs (all labs ordered are listed, but only abnormal results are displayed) Labs Reviewed  CULTURE, BLOOD (SINGLE)  CBC WITH DIFFERENTIAL/PLATELET  COMPREHENSIVE METABOLIC PANEL WITH GFR  URINALYSIS, ROUTINE W REFLEX MICROSCOPIC  BRAIN NATRIURETIC PEPTIDE  I-STAT CG4 LACTIC ACID, ED  TROPONIN I (HIGH SENSITIVITY)    EKG None  Radiology DG Chest Port 1 View Result Date: 09/19/2023 CLINICAL DATA:  83 year old male status post fall on blood thinners. EXAM: PORTABLE CHEST 1 VIEW COMPARISON:  Portable chest 09/14/2020. FINDINGS: Portable AP semi upright view at 0648 hours. The chronically low but slightly lower lung volumes. Mediastinal contours are stable and within normal limits. Visualized tracheal air column is within normal limits. Patchy left greater than right lung base opacity most resembles atelectasis. No pneumothorax, pulmonary edema, pleural effusion or air bronchograms identified. Negative visible bowel gas. No acute osseous abnormality identified. IMPRESSION: Low lung volumes with lung base opacity most resembling atelectasis. No acute traumatic injury identified. Electronically Signed   By: Odessa Fleming M.D.   On: 09/19/2023 07:01    Procedures Procedures    Medications Ordered in ED Medications - No data to display  ED Course/ Medical Decision Making/ A&P                                 Medical Decision Making Amount and/or Complexity of Data Reviewed Labs: ordered. Radiology: ordered.   \Presents for  generalized weakness.  He was noted to be hypotensive by EMS.  He is afebrile at arrival, only complaint is generalized weakness.  No focal findings, no unilateral weakness to suggest stroke.  Patient will be given IV fluids for his mild hypotension.  Workup initiated including cardiac evaluation, infectious evaluation.  Patient does have swelling of his legs, noted to have grade 1 diastolic heart failure on echo  2 years ago but did have normal ejection fraction at that time.  Patient will be signed out to oncoming ER physician to follow results.        Final Clinical Impression(s) / ED Diagnoses Final diagnoses:  Generalized weakness    Rx / DC Orders ED Discharge Orders     None         Arriah Wadle, Marine Sia, MD 09/19/23 [phone]

## 2023-09-19 NOTE — Addendum Note (Signed)
 Addended by: Hilton Lucky on: 09/19/2023 11:35 AM   Modules accepted: Orders

## 2023-09-19 NOTE — Hospital Course (Addendum)
 83 year old. Morbidly obese, paroxysmal A-Fib, hx of CHF, coming in as trauma activation, near syncopal episode after he stood up from bed, but felt a little light headed. On eliquis , no signs of trauma. Soft blood pressures, mid 80's, low 100s now. EKG, BNP, all ok. Volume related, some peripheral edema. IVC was collapsible, may be volume related.   Medications:  Lasix  80mg  4 times a week PRN    Woke up this morning to go to the bathroom, felt his legs being week and fell. Was not dizzy at that time, no loss of consciousness. No chest pain, no shortness of breath. Did he hit his head, trip over something. Diet has been ok with no problems. Last time he took lasix  was yesterday, takes it almost every day. Has not taken it today. Never experienced dizziness with lasix . Has not felt off balance, no nausea or vomiting, no trouble urinating. This morning had an incontinent episode while falling.   Positive for dizziness with position changes, can't lay on his right side and not be dizzy when he straightnes up. Some head positions can make him dizzy. Postiive for weakness  Did he lose consciousness - no   What does his BP usually read? 244 appears to be hsi dry weight.   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  Makes his own wine  No tobacco use, or other drugs  Family History:  Mom died of cervical cancer    21 year old person with a history of chronic heart failure with recovered ejection fraction on diuretics as needed admitted to our service with hypotension, syncope most likely due to hypovolemia.  On exam he is much improved today after receiving 2 L of IV fluids on admission.  Hypotension has resolved and orthostatics are normal this morning.  Etiology of the hypovolemia is unclear, he denies any recent illness, changes in activities, or changes in the dose of his diuretic.  I wonder if the diuretic dose is just a little too strong for him at this point now that his EF has recovered and that he is getting a little older.  Will recommend holding Lasix  for now, would only take if he has a weight increase at home.  Will need close follow-up with his primary care physician to check on volume status sometime next week   Reyli Schroth is a 83 y.o. male with a history of paroxysmal atrial fibrillation on eloquis, CHF secondary to atrial fibrillation with rvr last EF 55-60%, hypertension, and obesity who called EMS after a fall this morning and was found to be hypotensive, admitted for workup of weakness and hypotension on hospital day 0.   #Hypotension #Presyncope, dizziness EMS found patient to be hypotensive with systolic in 80s upon their arrival. His BP has been  labile, initially improved after 1.5L fluid but then decreased with MAPs <60 within hours. It improved to 104/60 on most recent check. He is not tachycardic which is concerning given his low BP. He denies feeling short of breath or having any chest pain. He has had both narrow and normal pulse pressures, thus our differential includes cardiogenic, obstructive, hypovolemic, or distributive shock. Cardiogenic shock is possible given his history of heart failure secondary to atrial fibrillation, but EKG does not show any arrhythmias and he has  well-perfused, warm extremities on exam. Hypovolemia could be etiology given patient had dark urine upon collection and his BP did initially improve after 1.5L of fluids though he denies changes in hydration recently. Infection is another possible etiology although he does not meet criteria for sepsis given unclear infection source with normal chest x-ray and unremarkable UA. He has also remained afebrile w/ normal white count, normal lactate x2, and normal HR. His RLE is warm with an erythematous rash, could possibly be a source of infection. We have a low threshold for starting broad spectrum antibiotics for possible cellulitis if he has persistent hypotension overnight despite fluids and/or develops a fever. We will also check 8 am cortisol to assess for adrenal insufficiency given hypotension has only mildly improved with 2.5L fluids. He is on Eliquis  making internal bleeding possible in setting of his fall this morning, but his hemoglobin is at 12.8, CT of brain with no acute bleed, and he is not tachycardic. CT scan did note potential normal pressure hydrocephalus, patient denies recent balance issues or incontinence outside of fall this morning. -follow-up echo -CBC pending -repeat EKG -8 am cortisol -Low threshold to consult PCCM for pressor management  -holding lasix ,spironolactone , cilostazol  -Give 1L if hypotensive overnight -add Ceftriaxone and Vanc for possible RLE cellulitis if persistent hypotension and/or fever -PT/OT, orthostatic vitals   #Generalized weakness Patient felt weak in both arms and legs this morning which he says has already improved. No muscle weakness or atrophy was noted on exam. Weakness could be in the setting of his hypotension. He denies being immobile recently and was at baseline strength as of yesterday. -TSH -PT/OT consult   #HFrecEF  Developed in 09/2020 in the setting of atrial fibrillation with RVR and EF 30-35%, underwent cardioversion and was was on  amiodarone  until oct 2022. His EF improved to 55-60% in fall of 2022. He is not on Ace/Arni due to issues with hypotension and does not take BB due to negative effect on his exercise tolerance. His current regimen is 80 mg Furosemide  5-7 days/week and 25 mg spironolactone  daily which he is adherent to. He does not feel his feet are more swollen than baseline and denies any SOB. His home BP monitor has not detected atrial fibrillation recently. He does not appear volume overloaded on exam with clear lungs and +1 pitting edema. Chest x-ray did not show signs of pulmonary edema. He is currently at his last documented (3/27) dry weight. -follow-up Echo -holding lasix  and spironolactone     #HTN -currently hypotensive, holding spironolactone       Diet: regular VTE: on eloquis IVF: received 2.5L so far Code: Full   Prior to Admission Living Arrangement: Home, living with wife Anticipated Discharge Location: pending medical stabilization Barriers to Discharge: pending medical stabilization   Dispo: Admit patient to IMTS, expected stay more than 2 midnights

## 2023-09-19 NOTE — Progress Notes (Addendum)
   09/19/23 1947  Vitals  Temp (!) 97.5 F (36.4 C)  Temp Source Oral  BP 116/70  MAP (mmHg) 82  BP Location Left Arm  BP Method Automatic  Patient Position (if appropriate) Lying  Pulse Rate 63  Pulse Rate Source Monitor  Resp 18  MEWS COLOR  MEWS Score Color Green  Oxygen Therapy  SpO2 98 %  O2 Device Room Air  MEWS Score  MEWS Temp 0  MEWS Systolic 0  MEWS Pulse 0  MEWS RR 0  MEWS LOC 0  MEWS Score 0    Admission notes:   New patient came from ED. Pt is A&O x 4. VSS, on room air. Bilateral lower legs with swelling noted. RLE, warm to touch and erythema noted. Denies pain at this time. Placed on cardiac monitoring and sinus rhythm with HR in 60's on the cardiac monitor. Urinals at bedside.  Educated on falls and safety precautions, call bell, plan of care and pt verbalizes understanding. Safety maintained. Bed alarm on. Call bell in reach. Will continue to monitor.  2 RN skin assessment done with Burr Cary, LPN

## 2023-09-19 NOTE — H&P (Addendum)
 Date: 09/19/2023               Patient Name:  Ronald [name] MRN: [id number]  DOB: Oct 03, 1940 Age / Sex: 83 y.o., male   PCP: Pcp, No         Medical Service: Internal Medicine Teaching Service         Attending Physician: Dr. Dickie La, MD      First Contact: [name]     Second Contact: Dr. Olegario Messier, MD Pager [phone]         After Hours (After 5p/  First Contact Pager: [phone]  weekends / holidays): Second Contact Pager: [phone]   SUBJECTIVE   Chief Complaint: leg weakness  History of Present Illness: Ronald [name] is a 83 year old male with a history of paroxysmal atrial fibrillation on eloquis, HFrecEF secondary to atrial fibrillation with rvr last EF 55-60%, hypertension, and obesity who called EMS after a fall this morning. He woke up this morning to go to the bathroom but felt his legs give out from under him. His arms also felt weak at this time. He was in his normal state of health and activity before his fall this morning. He denies being dizzy or lightheaded, losing consciousness, or hitting his head during the fall. He denies feeling off balance recently. He notes that he did experience lightheadedness when trying to sit up in the stretcher today. He gets dizzy going from sitting to standing about 3-4x year. He can remember a time a few years ago where he felt his legs give out but does not recall cause or any treatment. He also denies any chest pain, shortness of breath, or nausea/vomiting during fall or recently. He said he has been eating and drinking normally, has not had any recent illness/fevers/chills or sick contacts. He has been urinating normally but had episode of incontinence during his fall. He last took his lasix yesterday which he days 5-7 days a week based on his schedule that day. He has never had dizziness after taking Lasix. He does endorse history of dizziness when laying on his right side or in certain head positions, but he hasn't  experienced this in months. He does not feel like his feet are more swollen than baseline, and he has not felt any pain in his right lower leg. He checks his BP at home which can detect if he is in atrial fibrillation, has not detected Afib recently. He said his BP at home consistently runs 127/80 and his dry weight is around 244. He feels his weakness is still present in arms and legs but has improved from this morning.   ED Course: EMS found patient to be hypotensive with systolic BP in mid 80's. EKG with no acute ischemia or arrhythmias, BNP, not elevated. Upon [place] his IVC was collapsible. CT head w/o contrast showing no acute intracranial CT findings. Chest x-ray showing low lung volumes with lung base opacity most resembling atelectasis.  Meds:  Current Meds  Medication Sig   apixaban (ELIQUIS) 5 MG TABS tablet Take 1 tablet (5 mg total) by mouth 2 (two) times daily. NEEDS LABS FOR ELIQUIS REFILLS, COME TO OFFICE.  THANK YOU   brimonidine-timolol (COMBIGAN) 0.2-0.5 % ophthalmic solution Place 1 drop into both eyes every 12 (twelve) hours.   cilostazol (PLETAL) 100 MG tablet Take 100 mg by mouth 2 (two) times daily.   clobetasol cream (TEMOVATE) 0.05 % Apply 1 application topically daily as needed (rash).  loratadine (CLARITIN) 10 MG tablet Take 10 mg by mouth daily as needed for allergies.   Multiple Vitamin (MULTIVITAMIN) tablet Take 1 tablet by mouth daily.   Multiple Vitamins-Minerals (PRESERVISION AREDS 2) CAPS Take 1 capsule by mouth in the morning and at bedtime.   Omega 3 1200 MG CAPS Take 1,200 mg by mouth daily.   vitamin B-12 (CYANOCOBALAMIN) 1000 MCG tablet Take 1,000 mcg by mouth daily.   [DISCONTINUED] furosemide (LASIX) 80 MG tablet Take 1 tablet (80 mg total) by mouth daily.   [DISCONTINUED] spironolactone (ALDACTONE) 25 MG tablet Take 0.5 tablets (12.5 mg total) by mouth daily.    Past Medical History  Past Surgical History:  Procedure Laterality Date   CARDIOVERSION N/A  09/19/2020   Procedure: CARDIOVERSION;  Surgeon: Lewayne Bunting, MD;  Location: [facility] ENDOSCOPY;  Service: Cardiovascular;  Laterality: N/A;   TEE WITHOUT CARDIOVERSION N/A 09/19/2020   Procedure: TRANSESOPHAGEAL ECHOCARDIOGRAM (TEE);  Surgeon: Lewayne Bunting, MD;  Location: [facility] ENDOSCOPY;  Service: Cardiovascular;  Laterality: N/A;   TONSILLECTOMY      Social:  Lives With: wife, she has dementia Occupation: runs a business Support: wife, son Ronald Needle  Level of Function: independent in all ADLs and iADLs PCP: na, follows with Dr. Jacques Navy for Cardiology Substances: gin and tonic every 3-4 days  Family History: mother with cervical cancer  Allergies: Allergies as of 09/19/2023   (No Known Allergies)    Review of Systems: A complete ROS was negative except as per HPI.   OBJECTIVE:   Physical Exam: Blood pressure 102/60, pulse 60, temperature 98.1 F (36.7 C), resp. rate 15, height 5\' 10"  (1.778 m), weight 111.1 kg, SpO2 99%.  Constitutional: well-appearing, lying in stretcher, in no acute distress HENT: normocephalic atraumatic, mucous membranes moist Eyes: conjunctiva non-erythematous Cardiovascular: regular rate and rhythm, no m/r/g, dorsalis pedis pulses palpated bilaterally, unable to assess JVP, brisk cap refill Pulmonary/Chest: normal work of breathing on room air, some expiratory wheezes bilaterally Abdominal: soft, non-tender, non-distended Neurological: alert & oriented x 3, 5/5 strength in bilateral upper and lower extremities, unable to move himself up in bed, some dysmetria on right FTN, proprioception intact in toes bilaterally, CN 2-12 intact, sensation in limbs intact Skin: right leg warmer than left, right shin w/erythematous rash with unclear margins, not raised, tender to deep palpation, no open wounds or ulcerations noted, both shins have +1 pitting edema, extremities warm   Labs: CBC    Component Value Date/Time   WBC 8.8 09/19/2023 0650   RBC 3.96 (L)  09/19/2023 0650   HGB 12.8 (L) 09/19/2023 0650   HGB 14.6 09/06/2020 1703   HCT 37.4 (L) 09/19/2023 0650   HCT 43.1 09/06/2020 1703   PLT 153 09/19/2023 0650   PLT 175 09/06/2020 1703   MCV 94.4 09/19/2023 0650   MCV 92 09/06/2020 1703   MCH 32.3 09/19/2023 0650   MCHC 34.2 09/19/2023 0650   RDW 13.8 09/19/2023 0650   RDW 14.2 09/06/2020 1703   LYMPHSABS 1.1 09/19/2023 0650   LYMPHSABS 2.3 12/22/2019 1411   MONOABS 0.8 09/19/2023 0650   EOSABS 0.0 09/19/2023 0650   EOSABS 0.1 12/22/2019 1411   BASOSABS 0.1 09/19/2023 0650   BASOSABS 0.0 12/22/2019 1411     CMP     Component Value Date/Time   NA 140 09/19/2023 0650   NA 141 04/13/2021 0936   NA 140 05/21/2019 0000   K 3.2 (L) 09/19/2023 0650   K 4.5 05/21/2019 0000   CL 104  09/19/2023 0650   CL 101 05/21/2019 0000   CO2 27 09/19/2023 0650   CO2 26 05/21/2019 0000   GLUCOSE 126 (H) 09/19/2023 0650   BUN 14 09/19/2023 0650   BUN 16 04/13/2021 0936   CREATININE 1.23 09/19/2023 0650   CALCIUM 8.9 09/19/2023 0650   CALCIUM 8.8 05/21/2019 0000   PROT 5.8 (L) 09/19/2023 0650   PROT 6.3 04/13/2021 0936   ALBUMIN 3.0 (L) 09/19/2023 0650   ALBUMIN 3.8 04/13/2021 0936   ALBUMIN 3.8 05/21/2019 0000   AST 17 09/19/2023 0650   AST 19 05/21/2019 0000   ALT 12 09/19/2023 0650   ALT 16 05/21/2019 0000   ALKPHOS 60 09/19/2023 0650   ALKPHOS 90 05/21/2019 0000   BILITOT 1.2 09/19/2023 0650   BILITOT 0.5 04/13/2021 0936   BILITOT 0.7 05/21/2019 0000   GFRNONAA 59 (L) 09/19/2023 0650   GFRNONAA 75 05/21/2019 0000   GFRAA 76 12/22/2019 1411    Imaging:  EKG: Sinus rhythm, RBBB and LAFB, findings unchanged from EKG on 10/02/2022  CT head wo contrast: 1. No acute intracranial CT findings. 2. Atrophy and small-vessel disease. 3. Moderate disproportionate ventricular prominence without evidence of obstructing mass. The findings could be due to atrophic ventriculomegaly or normal pressure hydrocephalus. 4. Sinus membrane  disease.     Chest x-ray: Low lung volumes with lung base opacity most resembling atelectasis. No acute traumatic injury identified.   ASSESSMENT & PLAN:   Assessment & Plan by Problem: Principal Problem:   Postural dizziness with presyncope   Elza Sortor is a 83 y.o. male with a history of paroxysmal atrial fibrillation on eloquis, CHF secondary to atrial fibrillation with rvr last EF 55-60%, hypertension, and obesity who called EMS after a fall this morning and was found to be hypotensive, admitted for workup of weakness and hypotension on hospital day 0.  #Hypotension #Presyncope, dizziness EMS found patient to be hypotensive with systolic in 80s upon their arrival. His BP has been labile, initially improved after 1.5L fluid but then decreased with MAPs <60 within hours. It improved to 104/60 on most recent check. He is not tachycardic which is concerning given his low BP. He denies feeling short of breath or having any chest pain. He has had both narrow and normal pulse pressures, thus our differential includes cardiogenic, obstructive, hypovolemic, or distributive shock. Cardiogenic shock is possible given his history of heart failure secondary to atrial fibrillation, but EKG does not show any arrhythmias and he has well-perfused, warm extremities on exam. Hypovolemia could be etiology given patient had dark urine upon collection and his BP did initially improve after 1.5L of fluids though he denies changes in hydration recently. Infection is another possible etiology although he does not meet criteria for sepsis given unclear infection source with normal chest x-ray and unremarkable UA. He has also remained afebrile w/ normal white count, normal lactate x2, and normal HR. His RLE is warm with an erythematous rash, could possibly be a source of infection. We have a low threshold for starting broad spectrum antibiotics for possible cellulitis if he has persistent hypotension overnight  despite fluids and/or develops a fever. We will also check 8 am cortisol to assess for adrenal insufficiency given hypotension has only mildly improved with 2.5L fluids. He is on Eliquis making internal bleeding possible in setting of his fall this morning, but his hemoglobin is at 12.8, CT of brain with no acute bleed, and he is not tachycardic. CT scan did note potential normal  pressure hydrocephalus, patient denies recent balance issues or incontinence outside of fall this morning. -follow-up echo -CBC pending -repeat EKG -8 am cortisol -Low threshold to consult PCCM for pressor management  -holding lasix,spironolactone, cilostazol -Give 1L if hypotensive overnight -add Ceftriaxone and Vanc for possible RLE cellulitis if persistent hypotension and/or fever -PT/OT, orthostatic vitals  #Generalized weakness Patient felt weak in both arms and legs this morning which he says has already improved. No muscle weakness or atrophy was noted on exam. Weakness could be in the setting of his hypotension. He denies being immobile recently and was at baseline strength as of yesterday. -TSH -PT/OT consult  #HFrecEF  Developed in 09/2020 in the setting of atrial fibrillation with RVR and EF 30-35%, underwent cardioversion and was was on amiodarone until oct 2022. His EF improved to 55-60% in fall of 2022. He is not on Ace/Arni due to issues with hypotension and does not take BB due to negative effect on his exercise tolerance. His current regimen is 80 mg Furosemide 5-7 days/week and 25 mg spironolactone daily which he is adherent to. He does not feel his feet are more swollen than baseline and denies any SOB. His home BP monitor has not detected atrial fibrillation recently. He does not appear volume overloaded on exam with clear lungs and +1 pitting edema. Chest x-ray did not show signs of pulmonary edema. He is currently at his last documented (3/27) dry weight. -follow-up Echo -holding lasix and  spironolactone   #HTN -currently hypotensive, holding spironolactone    Diet: regular VTE: on eloquis IVF: received 2.5L so far Code: Full  Prior to Admission Living Arrangement: Home, living with wife Anticipated Discharge Location: pending medical stabilization Barriers to Discharge: pending medical stabilization  Dispo: Admit patient to IMTS, expected stay more than 2 midnights  Signed: Andriette Keeling, Medical Student 09/19/2023, 5:17 PM    Attestation for Student Documentation:  I personally was present and re-performed the history, physical exam and medical decision-making activities of this service and have verified that the service and findings are accurately documented in the student's note.  Mehgan Santmyer, MD 09/19/2023, 6:08 PM

## 2023-09-20 ENCOUNTER — Observation Stay (HOSPITAL_COMMUNITY)

## 2023-09-20 DIAGNOSIS — E861 Hypovolemia: Secondary | ICD-10-CM

## 2023-09-20 DIAGNOSIS — R42 Dizziness and giddiness: Secondary | ICD-10-CM | POA: Diagnosis not present

## 2023-09-20 LAB — CBC
HCT: 36.1 % — ABNORMAL LOW (ref 39.0–52.0)
Hemoglobin: 12.2 g/dL — ABNORMAL LOW (ref 13.0–17.0)
MCH: 31.8 pg (ref 26.0–34.0)
MCHC: 33.8 g/dL (ref 30.0–36.0)
MCV: 94 fL (ref 80.0–100.0)
Platelets: 156 10*3/uL (ref 150–400)
RBC: 3.84 MIL/uL — ABNORMAL LOW (ref 4.22–5.81)
RDW: 14.1 % (ref 11.5–15.5)
WBC: 6.9 10*3/uL (ref 4.0–10.5)
nRBC: 0 % (ref 0.0–0.2)

## 2023-09-20 LAB — BASIC METABOLIC PANEL WITH GFR
Anion gap: 8 (ref 5–15)
BUN: 12 mg/dL (ref 8–23)
CO2: 25 mmol/L (ref 22–32)
Calcium: 8.5 mg/dL — ABNORMAL LOW (ref 8.9–10.3)
Chloride: 108 mmol/L (ref 98–111)
Creatinine, Ser: 0.9 mg/dL (ref 0.61–1.24)
GFR, Estimated: >60 mL/min (ref >60)
Glucose, Bld: 86 mg/dL (ref 70–99)
Potassium: 3.5 mmol/L (ref 3.5–5.1)
Sodium: 141 mmol/L (ref 135–145)

## 2023-09-20 LAB — ECHOCARDIOGRAM COMPLETE
AR max vel: 2.68 cm2
AV Area VTI: 2.89 cm2
AV Area mean vel: 2.49 cm2
AV Mean grad: 4 mmHg
AV Peak grad: 6.8 mmHg
Ao pk vel: 1.3 m/s
Area-P 1/2: 3.2 cm2
Calc EF: 65 %
Height: 70 in
MV VTI: 3.56 cm2
S' Lateral: 3.3 cm
Single Plane A2C EF: 65.6 %
Single Plane A4C EF: 63.4 %
Weight: 3920 [oz_av]

## 2023-09-20 LAB — CORTISOL-AM, BLOOD: Cortisol - AM: 11 ug/dL (ref 6.7–22.6)

## 2023-09-20 NOTE — Evaluation (Signed)
 Occupational Therapy Evaluation Patient Details Name: Ronald [name] MRN: [id number] DOB: 1940/09/23 Today's Date: 09/20/2023   History of Present Illness   83 yo male admitted 4/17 with sudden weakness getting out of bed resulting in fall. PMhx: obesity, Afib, CHF     Clinical Impressions PTA, pt lives with wife who has mild dementia. Pt reports being completely independent in ADLs, household IADLs and mobility without AD though does not drive d/t macular degeneration at baseline. Pt presents now with deficits in standing balance d/t dizziness that pt reports is similar to a prior vertigo episode. Overall, pt requires Min A for bathroom mobility via handheld assist to correct balance (as no RW available in room or on unit at time of eval). Pt requires CGA to Min A for standing LB ADL completion. Educated on safety precautions for ADLs to decrease fall risk and encouraged use of DME for mobility at home. Anticipate continued progress acutely with OOB activity to return home with HHOT.   BP supine: 120/67, 80 bpm BP sitting: 131/74, 81 bpm BP standing: 132/76, 83bpm BP post activity: 126/65, 84 bpm     If plan is discharge home, recommend the following:   A little help with walking and/or transfers;A little help with bathing/dressing/bathroom;Assistance with cooking/housework;Help with stairs or ramp for entrance;Assist for transportation     Functional Status Assessment   Patient has had a recent decline in their functional status and demonstrates the ability to make significant improvements in function in a reasonable and predictable amount of time.     Equipment Recommendations   None recommended by OT     Recommendations for Other Services         Precautions/Restrictions   Precautions Precautions: Fall;Other (comment) Precaution/Restrictions Comments: dizziness with activity Restrictions Weight Bearing Restrictions Per Provider Order: No     Mobility Bed  Mobility Overal bed mobility: Needs Assistance Bed Mobility: Supine to Sit     Supine to sit: Min assist     General bed mobility comments: handheld assist to lift trunk    Transfers Overall transfer level: Needs assistance Equipment used: None Transfers: Sit to/from Stand Sit to Stand: Contact guard assist           General transfer comment: CGA, assist to correct posterior bias when turning to look at BP machine      Balance Overall balance assessment: Needs assistance Sitting-balance support: No upper extremity supported, Feet supported Sitting balance-Leahy Scale: Good     Standing balance support: Bilateral upper extremity supported, During functional activity Standing balance-Leahy Scale: Poor                             ADL either performed or assessed with clinical judgement   ADL Overall ADL's : Needs assistance/impaired Eating/Feeding: Independent   Grooming: Contact guard assist;Standing;Wash/dry hands   Upper Body Bathing: Set up;Sitting   Lower Body Bathing: Contact guard assist;Sitting/lateral leans;Sit to/from stand   Upper Body Dressing : Set up;Sitting   Lower Body Dressing: Minimal assistance;Sitting/lateral leans;Sit to/from stand   Toilet Transfer: Minimal assistance;Ambulation;Regular Teacher, adult education Details (indicate cue type and reason): Min A with handheld assist as no RW present in room or able to be located on unit at time of eval. Very unsteady, reaching out for support with other UE as well. Able to stand from toilet w/ grab bar and CGA to stabilize Toileting- Clothing Manipulation and Hygiene: Contact guard assist;Sitting/lateral lean;Sit to/from stand Toileting -  Clothing Manipulation Details (indicate cue type and reason): able to manage hygiene seated on toilet     Functional mobility during ADLs: Minimal assistance General ADL Comments: Discussed seated ADLs at home, use of RW for stability and to decrease  fall risk.     Vision Baseline Vision/History: 6 Macular Degeneration;1 Wears glasses Ability to See in Adequate Light: 1 Impaired Patient Visual Report: No change from baseline Vision Assessment?: Vision impaired- to be further tested in functional context Additional Comments: macular degeneration at baseline     Perception         Praxis         Pertinent Vitals/Pain Pain Assessment Pain Assessment: No/denies pain     Extremity/Trunk Assessment Upper Extremity Assessment Upper Extremity Assessment: Overall WFL for tasks assessed;Right hand dominant   Lower Extremity Assessment Lower Extremity Assessment: Defer to PT evaluation   Cervical / Trunk Assessment Cervical / Trunk Assessment: Normal   Communication Communication Communication: Impaired Factors Affecting Communication: Hearing impaired   Cognition Arousal: Alert Behavior During Therapy: WFL for tasks assessed/performed Cognition: No apparent impairments                               Following commands: Intact       Cueing  General Comments   Cueing Techniques: Verbal cues;Gestural cues      Exercises     Shoulder Instructions      Home Living Family/patient expects to be discharged to:: Private residence Living Arrangements: Spouse/significant other Available Help at Discharge: Family Type of Home: House Home Access: Stairs to enter [employer] of Steps: 12 Entrance Stairs-Rails: Right Home Layout: Two level;Able to live on main level with bedroom/bathroom;Laundry or work area in Artist of Steps: Restaurant manager, fast food Shower/Tub: Arts development officer Toilet: Handicapped height     Home Equipment: Agricultural consultant (2 wheels);Cane - single point;Shower seat - built in   Additional Comments: laundry in basement      Prior Functioning/Environment Prior Level of Function : Independent/Modified Independent              Mobility Comments: no AD for mobility ADLs Comments: reports Indep with ADLs, IADLs, does not drive really due to macular degeneration. Wife has mild dementia so pt managing meds and meals at home but she is able to do her self care.    OT Problem List: Decreased activity tolerance;Impaired balance (sitting and/or standing);Decreased knowledge of use of DME or AE   OT Treatment/Interventions: Self-care/ADL training;Therapeutic exercise;Energy conservation;DME and/or AE instruction;Therapeutic activities;Patient/family education      OT Goals(Current goals can be found in the care plan section)   Acute Rehab OT Goals Patient Stated Goal: go home, resolve dizziness OT Goal Formulation: With patient Time For Goal Achievement: 10/04/23 Potential to Achieve Goals: Good   OT Frequency:  Min 2X/week    Co-evaluation              AM-PAC OT "6 Clicks" Daily Activity     Outcome Measure Help from another person eating meals?: None Help from another person taking care of personal grooming?: A Little Help from another person toileting, which includes using toliet, bedpan, or urinal?: A Little Help from another person bathing (including washing, rinsing, drying)?: A Little Help from another person to put on and taking off regular upper body clothing?: A Little Help from another person to put on and taking off regular  lower body clothing?: A Little 6 Click Score: 19   End of Session Equipment Utilized During Treatment: Gait belt  Activity Tolerance: Patient tolerated treatment well Patient left: with call bell/phone within reach;in chair;with chair alarm set;Other (comment) (no nurse call cord available)  OT Visit Diagnosis: Unsteadiness on feet (R26.81);Other abnormalities of gait and mobility (R26.89)                Time: 0722-0759 OT Time Calculation (min): 37 min Charges:  OT General Charges $OT Visit: 1 Visit OT Evaluation $OT Eval Low Complexity: 1 Low OT  Treatments $Self Care/Home Management : 8-22 mins  Lawrence Pretty, OTR/L Acute Rehab Services Office: [phone]   Annabella Barr 09/20/2023, 8:16 AM

## 2023-09-20 NOTE — Discharge Summary (Signed)
 Name: Ballard Budney MRN: [id number] DOB: 08/01/1940 83 y.o. PCP: Pcp, No  Date of Admission: 09/19/2023  6:43 AM Date of Discharge:  09/20/2023 Attending Physician: Dr. Jerrell  DISCHARGE DIAGNOSIS:  Primary Problem: Hypotension due to hypovolemia   Hospital Problems: Principal Problem:   Hypotension due to hypovolemia Active Problems:   Paroxysmal atrial fibrillation (HCC)   Chronic heart failure with preserved ejection fraction (HFpEF) (HCC)    DISCHARGE MEDICATIONS:   Allergies as of 09/20/2023   No Known Allergies      Medication List     STOP taking these medications    cilostazol  100 MG tablet Commonly known as: PLETAL    furosemide  80 MG tablet Commonly known as: LASIX    spironolactone  25 MG tablet Commonly known as: ALDACTONE        TAKE these medications    apixaban  5 MG Tabs tablet Commonly known as: ELIQUIS  Take 1 tablet (5 mg total) by mouth 2 (two) times daily. NEEDS LABS FOR ELIQUIS  REFILLS, COME TO OFFICE.  THANK YOU   clobetasol cream 0.05 % Commonly known as: TEMOVATE Apply 1 application topically daily as needed (rash).   Combigan  0.2-0.5 % ophthalmic solution Generic drug: brimonidine -timolol  Place 1 drop into both eyes every 12 (twelve) hours.   cyanocobalamin 1000 MCG tablet Commonly known as: VITAMIN B12 Take 1,000 mcg by mouth daily.   loratadine  10 MG tablet Commonly known as: CLARITIN  Take 10 mg by mouth daily as needed for allergies.   multivitamin tablet Take 1 tablet by mouth daily.   Omega 3 1200 MG Caps Take 1,200 mg by mouth daily.   PreserVision AREDS 2 Caps Take 1 capsule by mouth in the morning and at bedtime.        DISPOSITION AND FOLLOW-UP:  Mr.Devarius Lard was discharged from [facility] in fair condition. At the hospital follow up visit please address:  Hypotension: we held his lasix , spironolactone , and cilostazol  in the setting of his hypotension. We have instructed him to  stop these medications at this time. He will need to follow up to manage the dosing of those medications in the setting of his heart failure, LE edema, and claudication.  CT head showing possible normal pressure hydrocephalus, does not have incontinence but had balance deficits per OT/PT.    Follow-up Recommendations: Consults: Cardiology Labs: Basic Metabolic Profile Studies: n/a Medications: Spironolactone  (half of a 25 mg tablet daily for 12.5 mg total), Eliquis  (5 mg BID)  Follow-up Appointments:  Follow-up Information     [facility] Follow up.   Specialty: Home Health Services Why: They will call within 24-48 hours of discharge to schedule home visit Contact information: [address] [place] [address] [phone]               Cardiology, PCP  HOSPITAL COURSE:   Patient Summary:  Tommie Bohlken is a 83 y.o. male with a history of paroxysmal atrial fibrillation on eloquis, CHF secondary to atrial fibrillation with rvr last EF 55-60%, hypertension, and obesity who called EMS after a fall yesterday morning and was found to be hypotensive, admitted for workup of generalized weakness and hypotension.   #Hypovolemic hypotension #Presyncope, dizziness EMS found patient to be hypotensive with systolic in 80s upon their arrival. His BP has been labile but stabilized after 2.5 L of fluid. We ordered an EKG and Echo to make sure his hypotension was not secondary to cardiogenic shock. EKG was consistent with his baseline and Echo results  are still pending but he did not appear volume overloaded on exam. He denied any recent changes in hydration or diet and has been taking current Lasix  dose for 3 years about 5-7 days a week. Patient did not have orthostatic vitals. It is possible his Lasix  dose is too strong given his preserved EF as of 03/2021 and contributed to his hypotension despite consistent regimen recently. We thus held his lasix ,  spironolactone , and cilostazol  during his stay and have instructed him to stop taking these until he sees his cardiologist. PT/OT saw patient and noted decreased balance and activity tolerance, and suggest home health PT and OT at least 2x a week.     #Generalized weakness Patient fell after feeling extremely weak in both arms and legs. No muscle weakness or atrophy was noted on exam. TSH was normal. This weakness could be a result of ongoing strength and balance issues in the setting of his hypotension.     #HFrecEF  Initial concern that his HF had a role in hypotension though patient did not appear volume overloaded on exam. He does not appear volume overloaded on exam with clear lungs and +1 pitting edema. Chest x-ray did not show signs of pulmonary edema, echo results still pending. He is currently at his last documented dry weight. Given his hypotension we held his lasix  and spironolactone  during his stay and have instructed him to stop taking these until he sees his cardiologist for further management. We will call him once the echo results are available.   Discharge Instructions     Call MD for:  difficulty breathing, headache or visual disturbances   Complete by: As directed    Call MD for:  extreme fatigue   Complete by: As directed    Call MD for:  persistant dizziness or light-headedness   Complete by: As directed    Call MD for:  persistant nausea and vomiting   Complete by: As directed    Call MD for:  temperature >100.4   Complete by: As directed    Diet - low sodium heart healthy   Complete by: As directed    Increase activity slowly   Complete by: As directed        SUBJECTIVE:   Mr. Hyams was sitting up in chair this morning working with OT. He said he was able to walk to the bathroom this morning with only some slight unsteadiness. He notes that he felt dizzy and very nauseas last night when trying to get out of bed but no longer endorses either. He feels his  weakness is greatly improved since yesterday. He continues to produce dark urine but has been eating and drinking during stay.   Discharge Vitals:   BP 120/66 (BP Location: Left Arm)   Pulse 70   Temp 98 F (36.7 C) (Oral)   Resp 18   Ht 5' 10 (1.778 m)   Wt 111.1 kg   SpO2 97%   BMI 35.15 kg/m   OBJECTIVE:  Physical Exam  Constitutional: well-appearing, sitting in chair, in no acute distress Cardiovascular: RRR, no murmurs, rubs or gallops Pulmonary/Chest: normal work of breathing on room air, lungs clear to auscultation bilaterally Abdominal: soft, non-tender, non-distended Skin: warm and dry Extremities: warm, well-perfused, +1 pitting edema in feet  Pertinent Labs, Studies, and Procedures:     Latest Ref Rng & Units 09/20/2023    5:56 AM 09/19/2023    6:50 AM 09/19/2020    6:08 AM  CBC  WBC  4.0 - 10.5 K/uL 6.9  8.8  6.2   Hemoglobin 13.0 - 17.0 g/dL 87.7  87.1  86.1   Hematocrit 39.0 - 52.0 % 36.1  37.4  41.9   Platelets 150 - 400 K/uL 156  153  169        Latest Ref Rng & Units 09/20/2023    5:56 AM 09/19/2023    6:50 AM 04/13/2021    9:36 AM  CMP  Glucose 70 - 99 mg/dL 86  873  88   BUN 8 - 23 mg/dL 12  14  16    Creatinine 0.61 - 1.24 mg/dL 9.09  8.76  8.85   Sodium 135 - 145 mmol/L 141  140  141   Potassium 3.5 - 5.1 mmol/L 3.5  3.2  4.0   Chloride 98 - 111 mmol/L 108  104  103   CO2 22 - 32 mmol/L 25  27  28    Calcium 8.9 - 10.3 mg/dL 8.5  8.9  9.1   Total Protein 6.5 - 8.1 g/dL  5.8  6.3   Total Bilirubin 0.0 - 1.2 mg/dL  1.2  0.5   Alkaline Phos 38 - 126 U/L  60  92   AST 15 - 41 U/L  17  18   ALT 0 - 44 U/L  12  19     CT HEAD WO CONTRAST ( ) Result Date: 09/19/2023 CLINICAL DATA:  Small partial EXAM: CT HEAD WITHOUT CONTRAST TECHNIQUE: Contiguous axial images were obtained from the base of the skull through the vertex without intravenous contrast. RADIATION DOSE REDUCTION: This exam was performed according to the departmental dose-optimization  program which includes automated exposure control, adjustment of the mA and/or kV according to patient size and/or use of iterative reconstruction technique. COMPARISON:  There are no comparison studies. FINDINGS: Brain: There is streak and scatter artifact on the images due to overlying wires. There is additional metallic artifact due to a subcutaneous small metal foreign body in the right cheek. There is mild cerebral cortical and slight cerebellar atrophy, with moderate disproportionate ventricular prominence without evidence of obstructing mass. The findings could be due to atrophic ventriculomegaly or normal pressure hydrocephalus. There is mild small-vessel disease of the cerebral white matter. No cortical based acute infarct, hemorrhage or mass effect are seen. There is no midline shift.  The basal cisterns are clear. Vascular: There are patchy calcifications in the carotid siphons. No hyperdense central vessel is seen. Skull: Negative for fractures or focal lesions. No visible scalp hematoma. Sinuses/Orbits: Mild membrane thickening in the paranasal sinuses without fluid levels or mastoid effusions. There is a 1.7 cm retention cyst in the floor the right maxillary sinus. Nasal septum is midline. Other: None. IMPRESSION: 1. No acute intracranial CT findings. 2. Atrophy and small-vessel disease. 3. Moderate disproportionate ventricular prominence without evidence of obstructing mass. The findings could be due to atrophic ventriculomegaly or normal pressure hydrocephalus. 4. Sinus membrane disease. Electronically Signed   By: Francis Quam M.D.   On: 09/19/2023 07:47   DG Chest Port 1 View Result Date: 09/19/2023 CLINICAL DATA:  83 year old male status post fall on blood thinners. EXAM: PORTABLE CHEST 1 VIEW COMPARISON:  Portable chest 09/14/2020. FINDINGS: Portable AP semi upright view at 0648 hours. The chronically low but slightly lower lung volumes. Mediastinal contours are stable and within normal  limits. Visualized tracheal air column is within normal limits. Patchy left greater than right lung base opacity most resembles atelectasis. No pneumothorax, pulmonary edema, pleural  effusion or air bronchograms identified. Negative visible bowel gas. No acute osseous abnormality identified. IMPRESSION: Low lung volumes with lung base opacity most resembling atelectasis. No acute traumatic injury identified. Electronically Signed   By: VEAR Hurst M.D.   On: 09/19/2023 07:01      Signed:  Charmaine Junie HOUST[facility] MS4

## 2023-09-20 NOTE — Care Management (Signed)
 Transition of Care [facility]) - Inpatient Brief Assessment   Patient Details  Name: Ronald [name] MRN: [id number] Date of Birth: 10/09/1940  [facility] CM/SW Contact:    Corean JAYSON Canary, RN Phone Number: 09/20/2023, 1:03 PM   Clinical Narrative:  Spoke with patient at bedside. He ia amenable to home health. He had [facility] previously, did not liek the therapist, would like another agnecy that is highly rated. Referral sent to [facility] from [facility]. For PT OT, no DME needs articulated.  Eager for discharge. Messaged with providers and nursing.   Transition of Care Asessment: Insurance and Status: Insurance coverage has been reviewed Patient has primary care physician: No Home environment has been reviewed: With Spouse Prior level of function:: Independent Prior/Current Home Services: No current home services Social Drivers of Health Review: SDOH reviewed no interventions necessary Readmission risk has been reviewed: Yes Transition of care needs: transition of care needs identified, TOC will continue to follow

## 2023-09-20 NOTE — Care Management Obs Status (Signed)
 MEDICARE OBSERVATION STATUS NOTIFICATION   Patient Details  Name: Ronald [name] MRN: [id number] Date of Birth: 07-Feb-1941   Medicare Observation Status Notification Given:  Yes    [name], RN 09/20/2023, 12:47 PM

## 2023-09-20 NOTE — Discharge Instructions (Addendum)
 Mr. Ronald, [name] were hospitalized after your fall due to persistent low blood pressure. We gave you intravenous fluids and your blood pressure improved. We also held your Furosemide  and Spironolactone  during your stay, as these medications can lower your blood pressure. We think that your Lasix  may have contributed to your low blood pressure and recent dizziness. We recommend that you stop taking the Furosemide  and spironolactone  at this time and follow up with your cardiologist for further medical management of your heart failure. You also saw occupational and physical therapists while you were here, and they are recommending home health OT and PT to continue working on your balance and strength.   Best, Internal Medicine Team

## 2023-09-20 NOTE — Evaluation (Signed)
 Physical Therapy Evaluation Patient Details Name: Ronald [name] MRN: [id number] DOB: [birth year] Today's Date: 09/20/2023  History of Present Illness  83 yo male admitted 4/17 with sudden weakness getting out of bed resulting in fall. PMhx: obesity, Afib, CHF  Clinical Impression  Pt pleasant and reports no true vertigo symptoms but rather feeling weak and wobbly. Pt without nystagmus or functional change in positional changes. Pt with edema, decreased sensation bilateral legs which impacts gait and stability with shuffling gait and pt reliant on RW for stability. Pt with decreased strength, function and mobility who will benefit from acute therapy to maximize function and independence as well as HHPT for balance and DME training.        If plan is discharge home, recommend the following: Assistance with cooking/housework;Help with stairs or ramp for entrance;Assist for transportation   Can travel by private vehicle        Equipment Recommendations None recommended by PT  Recommendations for Other Services       Functional Status Assessment Patient has had a recent decline in their functional status and demonstrates the ability to make significant improvements in function in a reasonable and predictable amount of time.     Precautions / Restrictions Precautions Precautions: Fall      Mobility  Bed Mobility Overal bed mobility: Modified Independent             General bed mobility comments: HOB 10 degrees, use of rail and increased time to rise to sitting, no nystagmus or dizziness with change in position    Transfers Overall transfer level: Modified independent                 General transfer comment: pt able to stand from bed and lower to chair without assist    Ambulation/Gait Ambulation/Gait assistance: Contact guard assist Gait Distance (Feet): 180 Feet Assistive device: Rolling walker (2 wheels) Gait Pattern/deviations: Step-through pattern, Decreased  stride length, Wide base of support   Gait velocity interpretation: 1.31 - 2.62 ft/sec, indicative of limited community ambulator   General Gait Details: pt with shuffling gait with increased BOS, cues or increased foot clearance and step length, cues to step into RW and for direction  Stairs            Wheelchair Mobility     Tilt Bed    Modified Rankin (Stroke Patients Only)       Balance Overall balance assessment: Needs assistance Sitting-balance support: No upper extremity supported, Feet supported Sitting balance-Leahy Scale: Good     Standing balance support: Bilateral upper extremity supported, During functional activity, Single extremity supported Standing balance-Leahy Scale: Poor Standing balance comment: in standing pt automatically reaching for support and reliant on RW with gait                             Pertinent Vitals/Pain Pain Assessment Pain Assessment: No/denies pain    Home Living Family/patient expects to be discharged to:: Private residence Living Arrangements: Spouse/significant other Available Help at Discharge: Family;Available PRN/intermittently Type of Home: House Home Access: Stairs to enter Entrance Stairs-Rails: Right Entrance Stairs-Number of Steps: 12 Alternate Level Stairs-Number of Steps: flight Home Layout: Two level;Able to live on main level with bedroom/bathroom;Laundry or work area in [employer] Equipment: Agricultural Consultant (2 wheels);Cane - single point;Shower seat - built in Additional Comments: laundry in basement    Prior Function Prior Level of Function : Independent/Modified Independent  Mobility Comments: no AD for mobility ADLs Comments: reports Indep with ADLs, IADLs, does not drive really due to macular degeneration. Wife has mild dementia so pt managing meds and meals at home but she is able to do her self care.     Extremity/Trunk Assessment   Upper Extremity Assessment Upper  Extremity Assessment: Overall WFL for tasks assessed    Lower Extremity Assessment Lower Extremity Assessment: Generalized weakness (edema)    Cervical / Trunk Assessment Cervical / Trunk Assessment: Normal  Communication   Communication Communication: No apparent difficulties Factors Affecting Communication: Hearing impaired    Cognition Arousal: Alert Behavior During Therapy: WFL for tasks assessed/performed   PT - Cognitive impairments: No apparent impairments                       PT - Cognition Comments: pt has a difficult time trying to clarify what he feels whether dizzy/weak Following commands: Intact       Cueing Cueing Techniques: Verbal cues     General Comments      Exercises     Assessment/Plan    PT Assessment Patient needs continued PT services  PT Problem List Decreased activity tolerance;Decreased balance;Decreased mobility;Decreased knowledge of use of DME       PT Treatment Interventions DME instruction;Gait training;Functional mobility training;Therapeutic activities;Therapeutic exercise;Neuromuscular re-education;Balance training;Patient/family education    PT Goals (Current goals can be found in the Care Plan section)  Acute Rehab PT Goals Patient Stated Goal: return home PT Goal Formulation: With patient Time For Goal Achievement: 10/04/23 Potential to Achieve Goals: Fair    Frequency Min 2X/week     Co-evaluation               AM-PAC PT 6 Clicks Mobility  Outcome Measure Help needed turning from your back to your side while in a flat bed without using bedrails?: None Help needed moving from lying on your back to sitting on the side of a flat bed without using bedrails?: A Little Help needed moving to and from a bed to a chair (including a wheelchair)?: None Help needed standing up from a chair using your arms (e.g., wheelchair or bedside chair)?: A Little Help needed to walk in hospital room?: A Little Help needed  climbing 3-5 steps with a railing? : A Little 6 Click Score: 20    End of Session   Activity Tolerance: Patient tolerated treatment well Patient left: in chair;with call bell/phone within reach;with chair alarm set Nurse Communication: Mobility status PT Visit Diagnosis: Other abnormalities of gait and mobility (R26.89);Difficulty in walking, not elsewhere classified (R26.2)    Time: 8986-8967 PT Time Calculation (min) (ACUTE ONLY): 19 min   Charges:   PT Evaluation $PT Eval Moderate Complexity: 1 Mod   PT General Charges $$ ACUTE PT VISIT: 1 Visit         Lenoard SQUIBB, PT Acute Rehabilitation Services Office: [phone]   Destany Severns B Korin Hartwell 09/20/2023, 11:11 AM

## 2023-09-20 NOTE — Plan of Care (Signed)
  Problem: Coping: Goal: Level of anxiety will decrease Outcome: Progressing   Problem: Elimination: Goal: Will not experience complications related to urinary retention Outcome: Progressing   Problem: Pain Managment: Goal: General experience of comfort will improve and/or be controlled Outcome: Progressing   Problem: Safety: Goal: Ability to remain free from injury will improve Outcome: Progressing

## 2023-09-20 NOTE — Progress Notes (Signed)
  Echocardiogram 2D Echocardiogram has been performed.  Rilynn Habel L Chairty Toman RDCS 09/20/2023, 9:52 AM

## 2023-09-23 ENCOUNTER — Encounter: Payer: Self-pay | Admitting: Internal Medicine

## 2023-09-24 LAB — CULTURE, BLOOD (SINGLE)
Culture: NO GROWTH
Special Requests: ADEQUATE

## 2023-09-24 NOTE — Telephone Encounter (Signed)
 Called and spoke to patient.  Rescheduled his hospital f/u; moved to 09/26/23 at 7:40 am (Video Visit) with Dr. Chancy Comber.    His main concerns are:   Fluid Retention; he has gained about 10 pounds since being d/c from hospital, his swelling has increased (mostly in the right leg) and also there is redness to legs, and this is new for him. He describes what seems like pitting edema to BLE.  He is not able to wear shoes/socks comfortably.    He is concerned about the meds (Cilostazol , Furosemide , and Spironolactone ) that were discontinued during hospital stay.  He is also not on Turmeric 1200 mg BID but would like to restart if possible.  The Cilostazol  "made a big difference" for him.    His most recent VS: BP=135/65, HR=66, SpO2=94%, Weight=255 lbs.

## 2023-09-26 ENCOUNTER — Ambulatory Visit: Attending: Internal Medicine | Admitting: Internal Medicine

## 2023-09-26 ENCOUNTER — Telehealth: Payer: Self-pay | Admitting: *Deleted

## 2023-09-26 VITALS — BP 158/94 | HR 64 | Ht 70.0 in | Wt 258.0 lb

## 2023-09-26 DIAGNOSIS — R600 Localized edema: Secondary | ICD-10-CM

## 2023-09-26 DIAGNOSIS — I739 Peripheral vascular disease, unspecified: Secondary | ICD-10-CM | POA: Diagnosis present

## 2023-09-26 DIAGNOSIS — I480 Paroxysmal atrial fibrillation: Secondary | ICD-10-CM

## 2023-09-26 DIAGNOSIS — E861 Hypovolemia: Secondary | ICD-10-CM

## 2023-09-26 DIAGNOSIS — D6869 Other thrombophilia: Secondary | ICD-10-CM | POA: Diagnosis present

## 2023-09-26 DIAGNOSIS — I10 Essential (primary) hypertension: Secondary | ICD-10-CM

## 2023-09-26 DIAGNOSIS — I5022 Chronic systolic (congestive) heart failure: Secondary | ICD-10-CM | POA: Diagnosis present

## 2023-09-26 MED ORDER — FUROSEMIDE 80 MG PO TABS
80.0000 mg | ORAL_TABLET | Freq: Every day | ORAL | 3 refills | Status: AC
Start: 2023-09-26 — End: 2024-03-06

## 2023-09-26 MED ORDER — CILOSTAZOL 100 MG PO TABS: 100.0000 mg | ORAL_TABLET | Freq: Two times a day (BID) | ORAL | 3 refills | Status: AC

## 2023-09-26 MED ORDER — SPIRONOLACTONE 25 MG PO TABS: 12.5000 mg | ORAL_TABLET | Freq: Every day | ORAL | 3 refills | Status: AC

## 2023-09-26 NOTE — Telephone Encounter (Signed)
  Patient Consent for Virtual Visit        Ronald [name] has provided verbal consent on 09/26/2023 for a virtual visit (video or telephone).   CONSENT FOR VIRTUAL VISIT FOR:  Ronald [name]  By participating in this virtual visit I agree to the following:  I hereby voluntarily request, consent and authorize [facility] and its employed or contracted physicians, physician assistants, nurse practitioners or other licensed health care professionals (the Practitioner), to provide me with telemedicine health care services (the "Services") as deemed necessary by the treating Practitioner. I acknowledge and consent to receive the Services by the Practitioner via telemedicine. I understand that the telemedicine visit will involve communicating with the Practitioner through live audiovisual communication technology and the disclosure of certain medical information by electronic transmission. I acknowledge that I have been given the opportunity to request an in-person assessment or other available alternative prior to the telemedicine visit and am voluntarily participating in the telemedicine visit.  I understand that I have the right to withhold or withdraw my consent to the use of telemedicine in the course of my care at any time, without affecting my right to future care or treatment, and that the Practitioner or I may terminate the telemedicine visit at any time. I understand that I have the right to inspect all information obtained and/or recorded in the course of the telemedicine visit and may receive copies of available information for a reasonable fee.  I understand that some of the potential risks of receiving the Services via telemedicine include:  Delay or interruption in medical evaluation due to technological equipment failure or disruption; Information transmitted may not be sufficient (e.g. poor resolution of images) to allow for appropriate medical decision making by the  Practitioner; and/or  In rare instances, security protocols could fail, causing a breach of personal health information.  Furthermore, I acknowledge that it is my responsibility to provide information about my medical history, conditions and care that is complete and accurate to the best of my ability. I acknowledge that Practitioner's advice, recommendations, and/or decision may be based on factors not within their control, such as incomplete or inaccurate data provided by me or distortions of diagnostic images or specimens that may result from electronic transmissions. I understand that the practice of medicine is not an exact science and that Practitioner makes no warranties or guarantees regarding treatment outcomes. I acknowledge that a copy of this consent can be made available to me via my patient portal [facility] MyChart), or I can request a printed copy by calling the office of [facility].    I understand that my insurance will be billed for this visit.   I have read or had this consent read to me. I understand the contents of this consent, which adequately explains the benefits and risks of the Services being provided via telemedicine.  I have been provided ample opportunity to ask questions regarding this consent and the Services and have had my questions answered to my satisfaction. I give my informed consent for the services to be provided through the use of telemedicine in my medical care

## 2023-09-26 NOTE — Patient Instructions (Addendum)
 Medication Instructions:  Restart:  Furosemide  (Lasix ) 80 mg (one tablet) once daily  Restart:  Cilostazol  (Pletal ) 100 mg (one tablet) Two times daily  Restart: Spironolactone  (Aldactone ) 12.5 mg (half tablet) once daily   Lab Work: NON Fasting CBC (complete Blood Count) and CMET (covers kidney function and liver function and electrolytes) to do in 7-14 days (due by 10/10/2023)  Follow-Up: At [facility], you and your health needs are our priority.  As part of our continuing mission to provide you with exceptional heart care, our providers are all part of one team.  This team includes your primary Cardiologist (physician) and Advanced Practice Providers or APPs (Physician Assistants and Nurse Practitioners) who all work together to provide you with the care you need, when you need it.  Your next appointment:   3 month(s) ; Virtual/Video Visit, next available appointment (please schedule either as first appointment or last appointment of the day)  Provider:   Gayatri A Acharya, MD    Other Instructions Please call us  or send a MyChart message with any Cardiology related questions/concerns.  [phone].  Thank you!         Valet parking services will be available as well.

## 2023-09-26 NOTE — Progress Notes (Signed)
 Virtual Visit via Video Note   Because of Ronald [name]'s co-morbid illnesses, he is at least at moderate risk for complications without adequate follow up.  This format is felt to be most appropriate for this patient at this time.  All issues noted in this document were discussed and addressed.  A limited physical exam was performed with this format.  Please refer to the patient's chart for his consent to telehealth for [facility].  Date:  09/26/2023   ID:  Ronald [name], DOB [birth year], MRN [id number] The patient was identified using 2 identifiers.  Patient Location: Home Provider Location: Office/Clinic   PCP:  Pcp, No   [facility] Providers Cardiologist:  Euell Herrlich, MD     Evaluation Performed:  Follow-Up Visit  History of Present Illness: .   Ronald [name] is a 83 y.o. male.  Discussed the use of AI scribe software for clinical note transcription with the patient, who gave verbal consent to proceed.  History of Present Illness The patient, with a history of hypertension and leg swelling, presents after a recent hospital admission due to a fall and low blood pressure. The patient had been taking turmeric supplements, which he has since stopped due to suspected interaction with other medications. The patient reports that his leg swelling has persisted since he stopped taking Lasix  (furosemide ) before his hospital admission. The patient has found cilostazol  beneficial for managing his claudication pain. The patient's blood pressure is currently elevated, and he reports that his blood pressure was well-controlled with Lasix  and spironolactone  prior to his hospital admission.    ROS: negative except per HPI above.  Studies Reviewed: .        Results LABS Kidney function: Mildly abnormal (09/19/2023)  DIAGNOSTIC EKG: Stable, minor progression of bifascicular block QRS duration (09/19/2023) Risk Assessment/Calculations:    CHA2DS2-VASc  Score = 6   This indicates a 9.7% annual risk of stroke. The patient's score is based upon: CHF History: 1 HTN History: 1 Diabetes History: 1 Stroke History: 0 Vascular Disease History: 1 Age Score: 2 Gender Score: 0           Physical Exam:   VS:  BP (!) 158/94   Pulse 64   Ht 5\' 10"  (1.778 m)   Wt 258 lb (117 kg)   SpO2 95%   BMI 37.02 kg/m    Wt Readings from Last 3 Encounters:  09/26/23 258 lb (117 kg)  09/19/23 245 lb (111.1 kg)  08/29/23 244 lb (110.7 kg)     Physical Exam VITAL SIGNS:  reviewed GEN:  no acute distress RESPIRATORY:  normal respiratory effort, no increased work of breathing NEURO:  alert and oriented x 3, speech normal PSYCH:  normal affect   ASSESSMENT AND PLAN: .    Assessment and Plan Assessment & Plan Hypertension Blood pressure elevated at 158 mmHg. Previously controlled with valsartan  and hydrochlorothiazide . Hypotensive episode may be linked to polypharmacy and use of high doses of turmeric, now stopped. Resuming furosemide  and spironolactone  to achieve target 130/80 mmHg and symptomatic control pt LE edema. - Resume furosemide  80 mg daily. - Resume spironolactone  as previously prescribed. - Monitor blood pressure regularly to maintain target of 130/80 mmHg. - labs in 7-14 days, patient has very limited ability to travel for labs, will get these when convenient for patient but mandatory given reinitiation of diuretic therapy.   Edema Increased edema after stopping furosemide  and spironolactone . Resuming these medications expected to manage edema. -  Resume furosemide  80 mg daily. - Resume spironolactone  as previously prescribed 12.5 mg daily.  Risk of bleeding due to medication Claudication Concurrent use of Eliquis  and cilostazol  increases bleeding risk. Minor bleeding reported with scratches, no significant events. Advised to monitor for gastrointestinal bleeding. - Monitor for signs of bleeding, including blood in stool. - Educate  on the increased risk of bleeding with concurrent use of Eliquis  and cilostazol . Patient feels strongly about resuming cilostazol .   Paroxysmal atrial fibrillation (HCC) Secondary hypercoagulable state (HCC) -has been in SR for some time, and amiodarone  may have been contributing to mild chronotropic incompetance sx. Amiodarone  has been stopped. -Continue Eliquis  5 mg twice daily.  Chronic systolic congestive heart failure (HCC)  -echo shows recovery of EF while in SR.  Heart Failure Therapy ACE-I/ARB/ARNI: Held in the setting of low normal blood pressures and suspicion that reduced LVEF was related to A. fib RVR. Per medical record did not tolerate irbesartan  during hospitalization and may not tolerate Entresto. With EF recovery, hold. BB: stopped for fatigue and exercise intolerance. MRA: Spironolactone  12.5 mg daily, restart today per patient strong preference SGLT2I: None  Diuretic plan: Resume Lasix  80 mg daily, helps with LE swelling and patient has recurrent LE edema without it. Can take PRN, takes about 4 times a week.  NYHA class I heart failure symptoms at this time.    Grady Lawman, MD, [facility]

## 2023-09-27 ENCOUNTER — Telehealth (HOSPITAL_BASED_OUTPATIENT_CLINIC_OR_DEPARTMENT_OTHER): Payer: Self-pay | Admitting: Licensed Clinical Social Worker

## 2023-09-27 NOTE — Telephone Encounter (Signed)
 H&V Care Navigation CSW Progress Note  Clinical Social Worker contacted patient by phone to f/u on assistance with PCP. Note he has schedule an appt to establish with Dr. Denette Finner. Left voicemail at [phone], encouraging him to let our team know if any questions or other resources needed at this time.  Patient is participating in a Managed Medicaid Plan:  No, Medicare and supplement  SDOH Screenings   Food Insecurity: No Food Insecurity (09/19/2023)  Housing: Low Risk  (09/19/2023)  Transportation Needs: No Transportation Needs (09/19/2023)  Utilities: Not At Risk (09/19/2023)  Alcohol Screen: Low Risk  (09/01/2021)  Depression (PHQ2-9): Low Risk  (09/01/2021)  Financial Resource Strain: Low Risk  (09/01/2021)  Physical Activity: Inactive (09/01/2021)  Social Connections: Moderately Isolated (09/19/2023)  Stress: No Stress Concern Present (09/01/2021)  Tobacco Use: Medium Risk (09/19/2023)    Nathen Balder, MSW, LCSW Clinical Social Worker II [facility] Heart/Vascular Care Navigation  [phone]- work cell phone (preferred) [phone]- desk phone

## 2023-10-09 LAB — CBC
Hematocrit: 38.7 % (ref 37.5–51.0)
Hemoglobin: 13.5 g/dL (ref 13.0–17.7)
MCH: 32.3 pg (ref 26.6–33.0)
MCHC: 34.9 g/dL (ref 31.5–35.7)
MCV: 93 fL (ref 79–97)
Platelets: 218 10*3/uL (ref 150–450)
RBC: 4.18 x10E6/uL (ref 4.14–5.80)
RDW: 12.7 % (ref 11.6–15.4)
WBC: 5.1 10*3/uL (ref 3.4–10.8)

## 2023-10-09 LAB — COMPREHENSIVE METABOLIC PANEL WITH GFR
ALT: 9 IU/L (ref 0–44)
AST: 11 IU/L (ref 0–40)
Albumin: 3.9 g/dL (ref 3.7–4.7)
Alkaline Phosphatase: 88 IU/L (ref 44–121)
BUN/Creatinine Ratio: 16 (ref 10–24)
BUN: 16 mg/dL (ref 8–27)
Bilirubin Total: 0.6 mg/dL (ref 0.0–1.2)
CO2: 27 mmol/L (ref 20–29)
Calcium: 9.3 mg/dL (ref 8.6–10.2)
Chloride: 102 mmol/L (ref 96–106)
Creatinine, Ser: 0.98 mg/dL (ref 0.76–1.27)
Globulin, Total: 2.2 g/dL (ref 1.5–4.5)
Glucose: 88 mg/dL (ref 70–99)
Potassium: 4.3 mmol/L (ref 3.5–5.2)
Sodium: 144 mmol/L (ref 134–144)
Total Protein: 6.1 g/dL (ref 6.0–8.5)
eGFR: 77 mL/min/{1.73_m2} (ref >59)

## 2023-10-13 ENCOUNTER — Other Ambulatory Visit: Payer: Self-pay | Admitting: Internal Medicine

## 2023-10-13 DIAGNOSIS — I480 Paroxysmal atrial fibrillation: Secondary | ICD-10-CM

## 2023-10-15 ENCOUNTER — Ambulatory Visit: Admitting: General Practice

## 2023-10-18 ENCOUNTER — Encounter: Payer: Self-pay | Admitting: *Deleted

## 2023-10-18 ENCOUNTER — Ambulatory Visit: Payer: Self-pay | Admitting: Internal Medicine

## 2023-11-19 ENCOUNTER — Encounter: Admitting: Family Medicine

## 2023-11-25 ENCOUNTER — Encounter: Admitting: Family Medicine

## 2023-11-29 ENCOUNTER — Other Ambulatory Visit: Payer: Self-pay

## 2023-11-29 DIAGNOSIS — I480 Paroxysmal atrial fibrillation: Secondary | ICD-10-CM

## 2023-11-29 MED ORDER — APIXABAN 5 MG PO TABS: 5.0000 mg | ORAL_TABLET | Freq: Two times a day (BID) | ORAL | 1 refills | Status: DC

## 2023-11-29 NOTE — Telephone Encounter (Signed)
 Prescription refill request for Eliquis  received. Indication:afib Last office visit:4/25 Scr:0.98  5/25 Age: 83 Weight:117  kg  Prescription refilled

## 2023-12-05 ENCOUNTER — Ambulatory Visit (INDEPENDENT_AMBULATORY_CARE_PROVIDER_SITE_OTHER): Admitting: Family Medicine

## 2023-12-05 ENCOUNTER — Encounter: Payer: Self-pay | Admitting: Family Medicine

## 2023-12-05 VITALS — BP 124/63 | HR 75 | Ht 70.0 in | Wt 248.8 lb

## 2023-12-05 DIAGNOSIS — Z7689 Persons encountering health services in other specified circumstances: Secondary | ICD-10-CM

## 2023-12-05 DIAGNOSIS — I480 Paroxysmal atrial fibrillation: Secondary | ICD-10-CM

## 2023-12-05 DIAGNOSIS — I739 Peripheral vascular disease, unspecified: Secondary | ICD-10-CM | POA: Diagnosis not present

## 2023-12-05 DIAGNOSIS — M722 Plantar fascial fibromatosis: Secondary | ICD-10-CM

## 2023-12-05 DIAGNOSIS — M25511 Pain in right shoulder: Secondary | ICD-10-CM

## 2023-12-05 NOTE — Progress Notes (Signed)
 New Patient Office Visit  Subjective   Patient ID: Ronald [name], male    DOB: 04-Aug-1940  Age: 83 y.o. MRN: [id number]  CC:  Chief Complaint  Patient presents with   Establish Care    HPI [name] presents to establish care  Subjective - No new concerns or issues reported - Recent hospitalization for hypotensive episode requiring IV fluids, all medications discontinued then reinstated by cardiologist - Takes furosemide  as needed, approximately every other day due to frequent urination limiting activities - Right leg issues: toenail fungus, suspected plantar fasciitis with morning pain and difficulty walking, recent ankle joint pain - Circulation problems with blockage causing leg to throb and ache after walking few hundred feet or standing 5-6 minutes, feels like carrying concrete block - Right shoulder pain for 6 months when reaching behind, muscle ache similar to post-injection soreness, possibly related to sleep position - Leg swelling improved with diuretics, less skin breakdown and bleeding  Medications: furosemide  as needed (taken every other day), cilostazol  for circulation, Eliquis , Combigan  eye drops, clobetasol cream occasionally for swollen areas. All cardiac medications reinstated by cardiologist after recent hospitalization.  PMHx: atrial fibrillation, heart failure with preserved ejection fraction. PSHx: TEE, cardioversion, vasectomy 35-40 years ago, colonoscopy 2021, possible fractures years ago. FHx: mother died cervical cancer, father stroke age 44. Social Hx: former tobacco use 35-40 years ago, former heavy alcohol use now moderate with occasional wine or gin and tonic, lives with [name] who assists with nail care, receives physical therapy, transportation limitations due to macular degeneration.  ROS: denies glaucoma, reports macular degeneration with low ocular pressure monitoring, dermatology lesion on chest previously frozen    Outpatient Encounter  Medications as of 12/05/2023  Medication Sig   apixaban  (ELIQUIS ) 5 MG TABS tablet Take 1 tablet (5 mg total) by mouth 2 (two) times daily.   brimonidine -timolol  (COMBIGAN ) 0.2-0.5 % ophthalmic solution Place 1 drop into both eyes every 12 (twelve) hours.   cilostazol  (PLETAL ) 100 MG tablet Take 1 tablet (100 mg total) by mouth 2 (two) times daily.   clobetasol cream (TEMOVATE) 0.05 % Apply 1 application topically daily as needed (rash).   furosemide  (LASIX ) 80 MG tablet Take 1 tablet (80 mg total) by mouth daily.   loratadine  (CLARITIN ) 10 MG tablet Take 10 mg by mouth daily as needed for allergies.   Multiple Vitamin (MULTIVITAMIN) tablet Take 1 tablet by mouth daily.   Multiple Vitamins-Minerals (PRESERVISION AREDS 2) CAPS Take 1 capsule by mouth in the morning and at bedtime.   Omega 3 1200 MG CAPS Take 1,200 mg by mouth daily.   spironolactone  (ALDACTONE ) 25 MG tablet Take 0.5 tablets (12.5 mg total) by mouth daily.   vitamin B-12 (CYANOCOBALAMIN) 1000 MCG tablet Take 1,000 mcg by mouth daily.   No facility-administered encounter medications on file as of 12/05/2023.    Past Medical History:  Diagnosis Date   Hypertension    Obesity (BMI 30-39.9) 06/24/2020    Past Surgical History:  Procedure Laterality Date   CARDIOVERSION N/A 09/19/2020   Procedure: CARDIOVERSION;  Surgeon: Pietro Redell RAMAN, MD;  Location: [facility] ENDOSCOPY;  Service: Cardiovascular;  Laterality: N/A;   TEE WITHOUT CARDIOVERSION N/A 09/19/2020   Procedure: TRANSESOPHAGEAL ECHOCARDIOGRAM (TEE);  Surgeon: Pietro Redell RAMAN, MD;  Location: [facility] ENDOSCOPY;  Service: Cardiovascular;  Laterality: N/A;   TONSILLECTOMY      No family history on file.  Social History   Socioeconomic History   Marital status: Married    Spouse name: Not  on file   Number of children: Not on file   Years of education: Not on file   Highest education level: Not on file  Occupational History   Not on file  Tobacco Use   Smoking status:  Former    Current packs/day: 0.50    Average packs/day: 0.5 packs/day for 40.0 years (20.0 ttl pk-yrs)    Types: Cigarettes   Smokeless tobacco: Never  Substance and Sexual Activity   Alcohol use: Yes   Drug use: Not on file   Sexual activity: Not on file  Other Topics Concern   Not on file  Social History Narrative   Not on file   Social Drivers of Health   Financial Resource Strain: Low Risk  (09/01/2021)   Overall Financial Resource Strain (CARDIA)    Difficulty of Paying Living Expenses: Not hard at all  Food Insecurity: No Food Insecurity (09/19/2023)   Hunger Vital Sign    Worried About Running Out of Food in the Last Year: Never true    Ran Out of Food in the Last Year: Never true  Transportation Needs: No Transportation Needs (09/19/2023)   PRAPARE - Administrator, Civil Service (Medical): No    Lack of Transportation (Non-Medical): No  Physical Activity: Inactive (09/01/2021)   Exercise Vital Sign    Days of Exercise per Week: 0 days    Minutes of Exercise per Session: 0 min  Stress: No Stress Concern Present (09/01/2021)   [employer] of Occupational Health - Occupational Stress Questionnaire    Feeling of Stress : Only a little  Social Connections: Moderately Isolated (09/19/2023)   Social Connection and Isolation Panel    Frequency of Communication with Friends and Family: Three times a week    Frequency of Social Gatherings with Friends and Family: Once a week    Attends Religious Services: Never    Database administrator or Organizations: No    Attends Banker Meetings: Never    Marital Status: Married  Catering manager Violence: Not At Risk (09/19/2023)   Humiliation, Afraid, Rape, and Kick questionnaire    Fear of Current or Ex-Partner: No    Emotionally Abused: No    Physically Abused: No    Sexually Abused: No    ROS     Objective   BP 124/63   Pulse 75   Ht 5' 10 (1.778 m)   Wt 248 lb 12 oz (112.8 kg)   SpO2 96%    BMI 35.69 kg/m   Physical Exam General: appears well HEENT: PERRLA, EOMI CV: regular rate and rhythm, no murmurs PULM: clear to auscultation EXTREMITIES: bilateral lower extremity edema, described as normal baseline, improved with diuretics MSK: right shoulder pain with internal rotation and cross-body adduction, consistent with rotator cuff inflammation SKIN: healing lesion on chest from previous cryotherapy, toenail fungus right foot     Assessment & Plan:   Encounter to establish care  Paroxysmal atrial fibrillation (HCC) Assessment & Plan:  Recent hospitalization for hypotension. Has restarted her medications since discharging. Currently stable on home regimen including furosemide  and anticoagulation. - Continue current cardiac medications as directed by cardiologist - Maintain Eliquis  therapy   Peripheral artery disease (HCC) Assessment & Plan: Reports significant functional limitation with walking and standing. Previous ABIs from 2023 showed possible arterial occlusion. Currently on cilostazol  with partial improvement. - Referral to cardiovascular surgery for evaluation of possible stenting procedure - Discussed surgical risks   Orders: -  Ambulatory referral to Vascular Surgery  Acute pain of right shoulder Assessment & Plan: Right shoulder pain, likely rotator cuff inflammation. Six-month history of pain with reaching behind. - Physical therapy for rotator cuff exercises - Continue current PT regimen with focus on shoulder rehabilitation   Plantar fasciitis Assessment & Plan: Plantar fasciitis, right foot. Chronic morning pain and heel discomfort with walking. - Continue current supportive footwear with arch support - Consider podiatry referral if symptoms persist     Return in about 3 months (around 03/06/2024) for pad, .   Toribio MARLA Slain, MD

## 2023-12-05 NOTE — Patient Instructions (Signed)
 It was nice to see you today,  We addressed the following topics today: -I will send in a referral to a vascular specialist so you can talk to them about the peripheral artery disease and if angioplasty or stenting would be an option. - Talk to your physical therapist and advised them that you would like to do some exercises for rotator cuff tendinopathy for your left shoulder.  Have a great day,  Rolan Slain, MD

## 2023-12-06 ENCOUNTER — Encounter: Payer: Self-pay | Admitting: Family Medicine

## 2023-12-06 DIAGNOSIS — M722 Plantar fascial fibromatosis: Secondary | ICD-10-CM | POA: Insufficient documentation

## 2023-12-06 DIAGNOSIS — I739 Peripheral vascular disease, unspecified: Secondary | ICD-10-CM | POA: Insufficient documentation

## 2023-12-06 DIAGNOSIS — M25511 Pain in right shoulder: Secondary | ICD-10-CM | POA: Insufficient documentation

## 2023-12-06 NOTE — Assessment & Plan Note (Signed)
 Right shoulder pain, likely rotator cuff inflammation. Six-month history of pain with reaching behind. - Physical therapy for rotator cuff exercises - Continue current PT regimen with focus on shoulder rehabilitation

## 2023-12-06 NOTE — Assessment & Plan Note (Signed)
 Plantar fasciitis, right foot. Chronic morning pain and heel discomfort with walking. - Continue current supportive footwear with arch support - Consider podiatry referral if symptoms persist

## 2023-12-06 NOTE — Assessment & Plan Note (Signed)
 Recent hospitalization for hypotension. Has restarted her medications since discharging. Currently stable on home regimen including furosemide  and anticoagulation. - Continue current cardiac medications as directed by cardiologist - Maintain Eliquis  therapy

## 2023-12-06 NOTE — Assessment & Plan Note (Signed)
 Reports significant functional limitation with walking and standing. Previous ABIs from 2023 showed possible arterial occlusion. Currently on cilostazol  with partial improvement. - Referral to cardiovascular surgery for evaluation of possible stenting procedure - Discussed surgical risks

## 2023-12-31 ENCOUNTER — Telehealth: Payer: Self-pay | Admitting: *Deleted

## 2023-12-31 NOTE — Telephone Encounter (Signed)
 Copied from CRM [id number]. Topic: General - Other >> Dec 30, 2023  1:21 PM Tobias L wrote: Reason for CRM: Jaydah with [facility] calling to confirm if fax was received for patient today.   Requesting callback: [phone]

## 2023-12-31 NOTE — Telephone Encounter (Signed)
 Contacted [facility] and informed them that we do have records scanned into the patients chart.

## 2024-01-01 ENCOUNTER — Other Ambulatory Visit: Payer: Self-pay

## 2024-01-01 DIAGNOSIS — I739 Peripheral vascular disease, unspecified: Secondary | ICD-10-CM

## 2024-01-10 ENCOUNTER — Encounter: Payer: Self-pay | Admitting: Family Medicine

## 2024-01-23 ENCOUNTER — Encounter: Payer: Self-pay | Admitting: Vascular Surgery

## 2024-01-23 ENCOUNTER — Ambulatory Visit (HOSPITAL_COMMUNITY)
Admission: RE | Admit: 2024-01-23 | Discharge: 2024-01-23 | Disposition: A | Discharge status: Home / Self Care | Source: Ambulatory Visit | Attending: Vascular Surgery | Admitting: Vascular Surgery

## 2024-01-23 ENCOUNTER — Ambulatory Visit (INDEPENDENT_AMBULATORY_CARE_PROVIDER_SITE_OTHER): Admitting: Vascular Surgery

## 2024-01-23 VITALS — BP 136/77 | HR 62 | Temp 98.3°F | Resp 20 | Ht 70.0 in | Wt 247.9 lb

## 2024-01-23 DIAGNOSIS — M48062 Spinal stenosis, lumbar region with neurogenic claudication: Secondary | ICD-10-CM | POA: Insufficient documentation

## 2024-01-23 DIAGNOSIS — M7989 Other specified soft tissue disorders: Secondary | ICD-10-CM | POA: Diagnosis present

## 2024-01-23 DIAGNOSIS — I739 Peripheral vascular disease, unspecified: Secondary | ICD-10-CM | POA: Diagnosis present

## 2024-01-23 LAB — VAS US ABI WITH/WO TBI
Left ABI: 1.02
Right ABI: 1.06

## 2024-01-23 NOTE — Progress Notes (Signed)
 Office Note     CC: Right lower extremity heaviness, concern for claudication, bilateral lower extremity edema Requesting Provider:  Chandra Toribio POUR, MD  HPI: Ronald [name] is a 83 y.o. (04-05-1941) male presenting at the request of .Chandra Toribio POUR, MD for right lower extremity heaviness, concern for claudication, bilateral lower extremity edema.  On exam, Ronald [name] was doing well, accompanied by his wife.  A native of [place] , he moved to [place] decades ago.  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  He and his wife live in [facility].  Ronald [name] has had bilateral lower extremity edema for over 50 years.  He stated over the last 3 years, it has gotten significantly worse.  States the majority of the edema is located above the level of the ankle, and involves bilateral calves.  This causes his legs to feel very heavy.  Edema has been so severe he has had skin breaks, with oozing.  He has worn compression in the past, but states that it does not work.  He does not elevate on a regular basis.  The right leg especially feels like he is dragging around a lead boot after ambulating roughly 50 yards.  He states he can walk through this, but usually stops and lets pain in his thigh past prior to continuing.  If he has a shopping cart, he can walk for an endless amount of time.  Patient uses cilostazol , and states that he can walk further with it and it has significantly improved the symptoms in the right leg. Denies symptoms of ischemic rest pain, tissue loss    Past Medical History:  Diagnosis Date   Hypertension    Obesity (BMI 30-39.9) 06/24/2020    Past Surgical History:  Procedure Laterality Date   CARDIOVERSION N/A 09/19/2020   Procedure: CARDIOVERSION;  Surgeon: Pietro Redell RAMAN, MD;  Location: [facility] ENDOSCOPY;  Service: Cardiovascular;  Laterality: N/A;   TEE WITHOUT CARDIOVERSION N/A 09/19/2020   Procedure: TRANSESOPHAGEAL ECHOCARDIOGRAM (TEE);  Surgeon:  Pietro Redell RAMAN, MD;  Location: [facility] ENDOSCOPY;  Service: Cardiovascular;  Laterality: N/A;   TONSILLECTOMY      Social History   Socioeconomic History   Marital status: Married    Spouse name: Not on file   Number of children: Not on file   Years of education: Not on file   Highest education level: Not on file  Occupational History   Not on file  Tobacco Use   Smoking status: Former    Current packs/day: 0.50    Average packs/day: 0.5 packs/day for 40.0 years (20.0 ttl pk-yrs)    Types: Cigarettes   Smokeless tobacco: Never  Substance and Sexual Activity   Alcohol use: Yes   Drug use: Not on file   Sexual activity: Not on file  Other Topics Concern   Not on file  Social History Narrative   Not on file   Social Drivers of Health   Financial Resource Strain: Low Risk  (09/01/2021)   Overall Financial Resource Strain (CARDIA)    Difficulty of Paying Living Expenses: Not hard at all  Food Insecurity: No Food Insecurity (09/19/2023)   Hunger Vital Sign    Worried About Running Out of Food in the Last Year: Never true    Ran Out of Food in the Last Year: Never true  Transportation Needs: No Transportation Needs (09/19/2023)   PRAPARE - Administrator, Civil Service (Medical): No    Lack of Transportation (Non-Medical): No  Physical Activity: Inactive (09/01/2021)   Exercise Vital Sign    Days of Exercise per Week: 0 days    Minutes of Exercise per Session: 0 min  Stress: No Stress Concern Present (09/01/2021)   [employer] of Occupational Health - Occupational Stress Questionnaire    Feeling of Stress : Only a little  Social Connections: Moderately Isolated (09/19/2023)   Social Connection and Isolation Panel    Frequency of Communication with Friends and Family: Three times a week    Frequency of Social Gatherings with Friends and Family: Once a week    Attends Religious Services: Never    Database administrator or Organizations: No    Attends Tax inspector Meetings: Never    Marital Status: Married  Catering manager Violence: Not At Risk (09/19/2023)   Humiliation, Afraid, Rape, and Kick questionnaire    Fear of Current or Ex-Partner: No    Emotionally Abused: No    Physically Abused: No    Sexually Abused: No   No family history on file.  Current Outpatient Medications  Medication Sig Dispense Refill   apixaban  (ELIQUIS ) 5 MG TABS tablet Take 1 tablet (5 mg total) by mouth 2 (two) times daily. 180 tablet 1   brimonidine -timolol  (COMBIGAN ) 0.2-0.5 % ophthalmic solution Place 1 drop into both eyes every 12 (twelve) hours.     cilostazol  (PLETAL ) 100 MG tablet Take 1 tablet (100 mg total) by mouth 2 (two) times daily. 180 tablet 3   clobetasol cream (TEMOVATE) 0.05 % Apply 1 application topically daily as needed (rash).     furosemide  (LASIX ) 80 MG tablet Take 1 tablet (80 mg total) by mouth daily. 90 tablet 3   Multiple Vitamin (MULTIVITAMIN) tablet Take 1 tablet by mouth daily.     Multiple Vitamins-Minerals (PRESERVISION AREDS 2) CAPS Take 1 capsule by mouth in the morning and at bedtime.     Omega 3 1200 MG CAPS Take 1,200 mg by mouth daily.     spironolactone  (ALDACTONE ) 25 MG tablet Take 0.5 tablets (12.5 mg total) by mouth daily. 45 tablet 3   vitamin B-12 (CYANOCOBALAMIN) 1000 MCG tablet Take 1,000 mcg by mouth daily.     No current facility-administered medications for this visit.    No Known Allergies   REVIEW OF SYSTEMS:   [X]  denotes positive finding, [ ]  denotes negative finding Cardiac  Comments:  Chest pain or chest pressure:    Shortness of breath upon exertion:    Short of breath when lying flat:    Irregular heart rhythm:        Vascular    Pain in calf, thigh, or hip brought on by ambulation:    Pain in feet at night that wakes you up from your sleep:     Blood clot in your veins:    Leg swelling:         Pulmonary    Oxygen at home:    Productive cough:     Wheezing:         Neurologic     Sudden weakness in arms or legs:     Sudden numbness in arms or legs:     Sudden onset of difficulty speaking or slurred speech:    Temporary loss of vision in one eye:     Problems with dizziness:         Gastrointestinal    Blood in stool:     Vomited blood:  Genitourinary    Burning when urinating:     Blood in urine:        Psychiatric    Major depression:         Hematologic    Bleeding problems:    Problems with blood clotting too easily:        Skin    Rashes or ulcers:        Constitutional    Fever or chills:      PHYSICAL EXAMINATION:  There were no vitals filed for this visit.  General:  WDWN in NAD; vital signs documented above Gait: Not observed HENT: WNL, normocephalic Pulmonary: normal non-labored breathing , without wheezing Cardiac: regular HR Abdomen: soft, NT, no masses Skin: without rashes Vascular Exam/Pulses:  Right Left  Radial 2+ (normal) 2+ (normal)  Ulnar    Femoral    Popliteal    DP 2+ (normal) 2+ (normal)  PT     Extremities: without ischemic changes, without Gangrene , without cellulitis; without open wounds;  Musculoskeletal: no muscle wasting or atrophy  Neurologic: A&O X 3;  No focal weakness or paresthesias are detected Psychiatric:  The pt has Normal affect.   Non-Invasive Vascular Imaging:    ABI Findings:  +---------+------------------+-----+---------+--------+  Right   Rt Pressure (mmHg)IndexWaveform Comment   +---------+------------------+-----+---------+--------+  Brachial 142                    triphasic          +---------+------------------+-----+---------+--------+  PTA     148               1.04 biphasic           +---------+------------------+-----+---------+--------+  DP      150               1.06 triphasic          +---------+------------------+-----+---------+--------+  Great Toe116               0.82                     +---------+------------------+-----+---------+--------+   +---------+------------------+-----+-----------+-------+  Left    Lt Pressure (mmHg)IndexWaveform   Comment  +---------+------------------+-----+-----------+-------+  Brachial 135                    triphasic           +---------+------------------+-----+-----------+-------+  PTA     254               1.79 multiphasic         +---------+------------------+-----+-----------+-------+  DP      145               1.02 triphasic           +---------+------------------+-----+-----------+-------+  Great Toe132               0.93                     +---------+------------------+-----+-----------+-------+   +-------+-----------+-----------+------------+------------+  ABI/TBIToday's ABIToday's TBIPrevious ABIPrevious TBI  +-------+-----------+-----------+------------+------------+  Right 1.06       0.82       1.31        1.05          +-------+-----------+-----------+------------+------------+  Left  1.02       0.93       0.98        0.84          +-------+-----------+-----------+------------+------------+  ASSESSMENT/PLAN: Ronald [name] is a 83 y.o. male presenting with chronic bilateral lower extremity edema and concern for right lower extremity claudication.  Regarding his claudication symptoms, they are somewhat atypical of peripheral arterial disease.  Usually pain begins in the calves, not the thigh.  Furthermore, he is able to walk through this pain and does not necessarily have to stop.  On physical exam, he had palpable pulses in the femorals and feet.  His ABI was normal, with normal waveforms.  Cilostazol  helping his symptoms is confounding as I think that this is neurogenic claudication.  I have offered him a referral to orthopedic spine surgery to work this up further.  Regarding bilateral lower extremity edema.  I think that this is likely primary lymphedema, however  interestingly it does not involve the dorsal aspect of the foot.  The distribution is most consistent with chronic venous insufficiency.  He does have some skin changes, therefore we discussed bilateral lower extremity venous reflux study to assess the veins.  We discussed that regardless, he would be best served with continued elevation and compression.  He was not interested in either.  My plan is to see him back in the office in 1 month's time for chronic venous insufficiency workup.  Should he have chronic reflux, my plan would be to pursue 3 months compression with possible venous ablation.  If negative, he will need continued elevation compression and referral from his primary care to lymphedema clinic.  Fonda FORBES Rim, MD [facility] [phone]

## 2024-01-24 ENCOUNTER — Other Ambulatory Visit: Payer: Self-pay | Admitting: Vascular Surgery

## 2024-01-24 DIAGNOSIS — M7989 Other specified soft tissue disorders: Secondary | ICD-10-CM

## 2024-02-06 ENCOUNTER — Ambulatory Visit (INDEPENDENT_AMBULATORY_CARE_PROVIDER_SITE_OTHER)

## 2024-02-06 VITALS — BP 150/89 | HR 73 | Ht 70.0 in | Wt 243.0 lb

## 2024-02-06 DIAGNOSIS — Z0000 Encounter for general adult medical examination without abnormal findings: Secondary | ICD-10-CM | POA: Diagnosis not present

## 2024-02-06 NOTE — Patient Instructions (Signed)
 Mr. Ronald [name] , Thank you for taking time out of your busy schedule to complete your Annual Wellness Visit with me. I enjoyed our conversation and look forward to speaking with you again next year. I, as well as your care team,  appreciate your ongoing commitment to your health goals. Please review the following plan we discussed and let me know if I can assist you in the future. Your Game plan/ To Do List    Referrals: If you haven't heard from the office you've been referred to, please reach out to them at the phone provided.   Follow up Visits: We will see or speak with you next year for your Next Medicare AWV with our clinical staff Have you seen your provider in the last 6 months (3 months if uncontrolled diabetes)? Yes  Clinician Recommendations:  Aim for 30 minutes of exercise or brisk walking, 6-8 glasses of water, and 5 servings of fruits and vegetables each day.       This is a list of the screenings recommended for you:  Health Maintenance  Topic Date Due   Flu Shot  01/03/2024   COVID-19 Vaccine (8 - Pfizer risk 2024-25 season) 02/03/2024   Medicare Annual Wellness Visit  02/05/2025   DTaP/Tdap/Td vaccine (3 - Td or Tdap) 03/04/2032   Pneumococcal Vaccine for age over 69  Completed   Zoster (Shingles) Vaccine  Completed   HPV Vaccine  Aged Out   Meningitis B Vaccine  Aged Out   Hepatitis C Screening  Discontinued    Advanced directives: (In Chart) A copy of your advanced directives are scanned into your chart should your provider ever need it. Advance Care Planning is important because it:  [x]  Makes sure you receive the medical care that is consistent with your values, goals, and preferences  [x]  It provides guidance to your family and loved ones and reduces their decisional burden about whether or not they are making the right decisions based on your wishes.  Follow the link provided in your after visit summary or read over the paperwork we have mailed to you to help you  started getting your Advance Directives in place. If you need assistance in completing these, please reach out to us  so that we can help you!  See attachments for Preventive Care and Fall Prevention Tips.

## 2024-02-06 NOTE — Progress Notes (Signed)
 Subjective:   Ronald [name] is a 83 y.o. who presents for a Medicare Wellness preventive visit.  As a reminder, Annual Wellness Visits don't include a physical exam, and some assessments may be limited, especially if this visit is performed virtually. We may recommend an in-person follow-up visit with your provider if needed.  Visit Complete: Virtual I connected with  Elsie Rival on 02/06/24 by a video and audio enabled telemedicine application and verified that I am speaking with the correct person using two identifiers.  Patient Location: Home  Provider Location: Home Office  I discussed the limitations of evaluation and management by telemedicine. The patient expressed understanding and agreed to proceed.  Vital Signs: Because this visit was a virtual/telehealth visit, some criteria may be missing or patient reported. Any vitals not documented were not able to be obtained and vitals that have been documented are patient reported.    Persons Participating in Visit: Patient.  AWV Questionnaire: Yes: Patient Medicare AWV questionnaire was completed by the patient on 02/04/2024; I have confirmed that all information answered by patient is correct and no changes since this date.  Cardiac Risk Factors include: male gender     Objective:    Today's Vitals   02/06/24 1122  BP: (!) 150/89  Pulse: 73  SpO2: 97%  Weight: 243 lb (110.2 kg)  Height: 5' 10 (1.778 m)   Body mass index is 34.87 kg/m.     02/06/2024   11:33 AM 09/19/2023    6:00 PM 09/19/2023    6:56 AM 09/14/2020    9:00 PM  Advanced Directives  Does Patient Have a Medical Advance Directive? Yes Yes Yes No  Type of Estate agent of [place];Living will Healthcare Power of [place];Living will Living will;Healthcare Power of Attorney   Does patient want to make changes to medical advance directive?  No - Patient declined    Copy of Healthcare Power of Attorney in Chart? Yes - validated most  recent copy scanned in chart (See row information) No - copy requested    Would patient like information on creating a medical advance directive?    No - Guardian declined    Current Medications (verified) Outpatient Encounter Medications as of 02/06/2024  Medication Sig   apixaban  (ELIQUIS ) 5 MG TABS tablet Take 1 tablet (5 mg total) by mouth 2 (two) times daily.   brimonidine -timolol  (COMBIGAN ) 0.2-0.5 % ophthalmic solution Place 1 drop into both eyes every 12 (twelve) hours.   cilostazol  (PLETAL ) 100 MG tablet Take 1 tablet (100 mg total) by mouth 2 (two) times daily.   clobetasol cream (TEMOVATE) 0.05 % Apply 1 application topically daily as needed (rash).   furosemide  (LASIX ) 80 MG tablet Take 1 tablet (80 mg total) by mouth daily.   Multiple Vitamin (MULTIVITAMIN) tablet Take 1 tablet by mouth daily.   Multiple Vitamins-Minerals (PRESERVISION AREDS 2) CAPS Take 1 capsule by mouth in the morning and at bedtime.   Omega 3 1200 MG CAPS Take 1,200 mg by mouth daily.   spironolactone  (ALDACTONE ) 25 MG tablet Take 0.5 tablets (12.5 mg total) by mouth daily.   vitamin B-12 (CYANOCOBALAMIN) 1000 MCG tablet Take 1,000 mcg by mouth daily.   No facility-administered encounter medications on file as of 02/06/2024.    Allergies (verified) Patient has no known allergies.   History: Past Medical History:  Diagnosis Date   Hypertension    Obesity (BMI 30-39.9) 06/24/2020   Peripheral vascular disease (HCC)    Past Surgical History:  Procedure Laterality Date   CARDIOVERSION N/A 09/19/2020   Procedure: CARDIOVERSION;  Surgeon: Pietro Redell RAMAN, MD;  Location: [facility] ENDOSCOPY;  Service: Cardiovascular;  Laterality: N/A;   TEE WITHOUT CARDIOVERSION N/A 09/19/2020   Procedure: TRANSESOPHAGEAL ECHOCARDIOGRAM (TEE);  Surgeon: Pietro Redell RAMAN, MD;  Location: [facility] ENDOSCOPY;  Service: Cardiovascular;  Laterality: N/A;   TONSILLECTOMY     History reviewed. No pertinent family history. Social History    Socioeconomic History   Marital status: Married    Spouse name: Not on file   Number of children: Not on file   Years of education: Not on file   Highest education level: Master's degree (e.g., MA, MS, MEng, MEd, MSW, MBA)  Occupational History   Not on file  Tobacco Use   Smoking status: Former    Current packs/day: 0.50    Average packs/day: 0.5 packs/day for 40.0 years (20.0 ttl pk-yrs)    Types: Cigarettes   Smokeless tobacco: Never  Vaping Use   Vaping status: Never Used  Substance and Sexual Activity   Alcohol use: Yes   Drug use: Never   Sexual activity: Not Currently  Other Topics Concern   Not on file  Social History Narrative   Not on file   Social Drivers of Health   Financial Resource Strain: Low Risk  (02/04/2024)   Overall Financial Resource Strain (CARDIA)    Difficulty of Paying Living Expenses: Not very hard  Food Insecurity: No Food Insecurity (02/04/2024)   Hunger Vital Sign    Worried About Running Out of Food in the Last Year: Never true    Ran Out of Food in the Last Year: Never true  Transportation Needs: Unmet Transportation Needs (02/04/2024)   PRAPARE - Transportation    Lack of Transportation (Medical): No    Lack of Transportation (Non-Medical): Yes  Physical Activity: Inactive (02/04/2024)   Exercise Vital Sign    Days of Exercise per Week: 0 days    Minutes of Exercise per Session: 0 min  Stress: Stress Concern Present (02/04/2024)   [employer] of Occupational Health - Occupational Stress Questionnaire    Feeling of Stress: To some extent  Social Connections: Socially Isolated (02/04/2024)   Social Connection and Isolation Panel    Frequency of Communication with Friends and Family: Once a week    Frequency of Social Gatherings with Friends and Family: Once a week    Attends Religious Services: Never    Database administrator or Organizations: No    Attends Engineer, structural: Not on file    Marital Status: Married     Tobacco Counseling Counseling given: Not Answered    Clinical Intake:  Pre-visit preparation completed: Yes  Pain : No/denies pain     Nutritional Risks: None Diabetes: No  Lab Results  Component Value Date   HGBA1C 5.5 09/15/2020     How often do you need to have someone help you when you read instructions, pamphlets, or other written materials from your doctor or pharmacy?: 1 - Never  Interpreter Needed?: No  Information entered by :: NAllen LPN   Activities of Daily Living     02/04/2024   12:12 PM 09/19/2023    6:00 PM  In your present state of health, do you have any difficulty performing the following activities:  Hearing? 1 1  Comment sometimes, high pitched voices   Vision? 1 0  Comment macular degeneration   Difficulty concentrating or making decisions? 1 0  Walking or  climbing stairs? 1   Dressing or bathing? 1   Doing errands, shopping? 1 1  Comment does not drive   Preparing Food and eating ? Y   Using the Toilet? N   In the past six months, have you accidently leaked urine? Y   Do you have problems with loss of bowel control? Y   Managing your Medications? N   Managing your Finances? N   Housekeeping or managing your Housekeeping? N     Patient Care Team: Chandra Toribio POUR, MD as PCP - General (Family Medicine) Loni Soyla LABOR, MD as PCP - Cardiology (Cardiology)  I have updated your Care Teams any recent Medical Services you may have received from other providers in the past year.     Assessment:   This is a routine wellness examination for Kenlee.  Hearing/Vision screen Hearing Screening - Comments:: Sometimes hearing issues Vision Screening - Comments:: Regular eye exams,    Goals Addressed             This Visit's Progress    Patient Stated       02/06/2024, wants to lose weight       Depression Screen     02/06/2024   11:36 AM 12/05/2023    2:25 PM 09/01/2021    1:04 PM 09/06/2020    4:00 PM 06/03/2019    3:04 PM   PHQ 2/9 Scores  PHQ - 2 Score 0 0 0 0 0  PHQ- 9 Score 3 5  6      Fall Risk     02/04/2024   12:12 PM 12/05/2023    2:25 PM 08/28/2021   12:38 PM 09/06/2020    3:59 PM 06/03/2019    3:04 PM  Fall Risk   Falls in the past year? 1 1 0 1 0   Comment trying to carry groceries, lost balance      Number falls in past yr: 0 0 0 1 0   Injury with Fall? 0 0 0 0 0  Risk for fall due to : Medication side effect No Fall Risks  Impaired mobility   Follow up Falls prevention discussed;Falls evaluation completed Falls evaluation completed  Falls evaluation completed       Data saved with a previous flowsheet row definition    MEDICARE RISK AT HOME:  Medicare Risk at Home Any stairs in or around the home?: (Patient-Rptd) Yes If so, are there any without handrails?: (Patient-Rptd) Yes Home free of loose throw rugs in walkways, pet beds, electrical cords, etc?: (Patient-Rptd) Yes Adequate lighting in your home to reduce risk of falls?: (Patient-Rptd) Yes Life alert?: (Patient-Rptd) No Use of a cane, walker or w/c?: (Patient-Rptd) No Shower chair or bench in shower?: (Patient-Rptd) No Elevated toilet seat or a handicapped toilet?: (Patient-Rptd) No  TIMED UP AND GO:  Was the test performed?  No  Cognitive Function: 6CIT completed        02/06/2024   11:40 AM  6CIT Screen  What Year? 0 points  What month? 0 points  What time? 0 points  Count back from 20 0 points  Months in reverse 0 points  Repeat phrase 2 points  Total Score 2 points    Immunizations Immunization History  Administered Date(s) Administered   Fluad Quad(high Dose 65+) 02/06/2019, 03/25/2022   Fluad Trivalent(High Dose 65+) 02/23/2023   INFLUENZA, HIGH DOSE SEASONAL PF 03/01/2021   PFIZER Comirnaty(Gray Top)Covid-19 Tri-Sucrose Vaccine 03/01/2021, 03/04/2022   PFIZER(Purple Top)SARS-COV-2 Vaccination 06/16/2019, 07/06/2019, 07/06/2019,  02/23/2020   PNEUMOCOCCAL CONJUGATE-20 03/01/2021   Pfizer(Comirnaty)Fall  Seasonal Vaccine 12 years and older 03/25/2022, 02/23/2023   Pneumococcal Conjugate-13 02/15/2014   Pneumococcal Polysaccharide-23 04/17/2007   Respiratory Syncytial Virus Vaccine,Recomb Aduvanted(Arexvy) 02/23/2023   Tdap 08/09/2011, 03/04/2022   Zoster Recombinant(Shingrix) 03/01/2021, 08/04/2021    Screening Tests Health Maintenance  Topic Date Due   INFLUENZA VACCINE  01/03/2024   COVID-19 Vaccine (8 - Pfizer risk 2024-25 season) 02/03/2024   Medicare Annual Wellness (AWV)  02/05/2025   DTaP/Tdap/Td (3 - Td or Tdap) 03/04/2032   Pneumococcal Vaccine: 50+ Years  Completed   Zoster Vaccines- Shingrix  Completed   HPV VACCINES  Aged Out   Meningococcal B Vaccine  Aged Out   Hepatitis C Screening  Discontinued    Health Maintenance  Health Maintenance Due  Topic Date Due   INFLUENZA VACCINE  01/03/2024   COVID-19 Vaccine (8 - Pfizer risk 2024-25 season) 02/03/2024   Health Maintenance Items Addressed: Due for flu and covid vaccine.  Additional Screening:  Vision Screening: Recommended annual ophthalmology exams for early detection of glaucoma and other disorders of the eye. Would you like a referral to an eye doctor? No    Dental Screening: Recommended annual dental exams for proper oral hygiene  Community Resource Referral / Chronic Care Management: CRR required this visit?  No   CCM required this visit?  No   Plan:    I have personally reviewed and noted the following in the patient's chart:   Medical and social history Use of alcohol, tobacco or illicit drugs  Current medications and supplements including opioid prescriptions. Patient is not currently taking opioid prescriptions. Functional ability and status Nutritional status Physical activity Advanced directives List of other physicians Hospitalizations, surgeries, and ER visits in previous 12 months Vitals Screenings to include cognitive, depression, and falls Referrals and appointments  In  addition, I have reviewed and discussed with patient certain preventive protocols, quality metrics, and best practice recommendations. A written personalized care plan for preventive services as well as general preventive health recommendations were provided to patient.   Ardella FORBES Dawn, LPN   [id number]   After Visit Summary: (MyChart) Due to this being a telephonic visit, the after visit summary with patients personalized plan was offered to patient via MyChart   Notes: Nothing significant to report at this time.

## 2024-02-18 ENCOUNTER — Other Ambulatory Visit (INDEPENDENT_AMBULATORY_CARE_PROVIDER_SITE_OTHER): Payer: Self-pay

## 2024-02-18 ENCOUNTER — Encounter: Payer: Self-pay | Admitting: Physical Medicine and Rehabilitation

## 2024-02-18 ENCOUNTER — Ambulatory Visit (INDEPENDENT_AMBULATORY_CARE_PROVIDER_SITE_OTHER): Admitting: Physical Medicine and Rehabilitation

## 2024-02-18 DIAGNOSIS — R202 Paresthesia of skin: Secondary | ICD-10-CM

## 2024-02-18 DIAGNOSIS — G8929 Other chronic pain: Secondary | ICD-10-CM | POA: Diagnosis not present

## 2024-02-18 DIAGNOSIS — M5416 Radiculopathy, lumbar region: Secondary | ICD-10-CM

## 2024-02-18 DIAGNOSIS — M5441 Lumbago with sciatica, right side: Secondary | ICD-10-CM | POA: Diagnosis not present

## 2024-02-18 NOTE — Progress Notes (Signed)
 Core Outcome Measures Index (COMI) Back Score  Average Pain 5  COMI Score 50 %

## 2024-02-18 NOTE — Progress Notes (Signed)
 Pain Scale   Average Pain 0 Patient advising he has chronic lower back pain radiating to right hip pain increases when walking and standing and pain is constant        +Driver, -BT, -Dye Allergies.

## 2024-02-18 NOTE — Progress Notes (Signed)
 Ronald [name] - 83 y.o. male MRN [id number]  Date of birth: [birth year]  Office Visit Note: Visit Date: 02/18/2024 PCP: Chandra Toribio POUR, MD Referred by: Chandra Toribio POUR, MD  Subjective: Chief Complaint  Patient presents with   Lower Back - Pain   HPI: Ronald [name] is a 83 y.o. male who comes in today per the request of Dr. Fonda Rim ([facility]) for evaluation of chronic, worsening and severe bilateral lower back pain radiating down right leg. Pain ongoing for several years, worsens with prolonged standing and walking. He describes pain as burning sensation, currently rates as 8 out of 10. Also reports chronic numbness to right lower extremity. Some relief of pain with home exercise regimen, rest and use of medications. History of formal physical therapy in the past with minimal relief of pain. No prior lumbar MRI imaging. No history of lumbar surgery/injections. Patient denies focal weakness. No recent trauma or falls.   Patients course is complicated by atrial fibrillation, he is currently taking Eliquis . He is managed from vascular standpoint by Dr. Rim, currently taking Pletal  for claudication symptoms.      Review of Systems  Musculoskeletal:  Positive for back pain.  Neurological:  Positive for tingling and sensory change. Negative for weakness.  All other systems reviewed and are negative.  Otherwise per HPI.  Assessment & Plan: Visit Diagnoses:    ICD-10-CM   1. Chronic bilateral low back pain with right-sided sciatica  G89.29 XR Lumbar Spine 2-3 Views   M54.41 MR LUMBAR SPINE WO CONTRAST    2. Lumbar radiculopathy  M54.16 XR Lumbar Spine 2-3 Views    MR LUMBAR SPINE WO CONTRAST    3. Paresthesia of skin  R20.2 XR Lumbar Spine 2-3 Views    MR LUMBAR SPINE WO CONTRAST       Plan: Findings:  Chronic, worsening and severe bilateral lower back pain radiating down right leg. Patient continues to have severe pain despite good conservative  therapies such as formal physical therapy, home exercise regimen, rest and use of medications. Patients clinical presentation and exam are consistent with claudication like symptoms. He does have pain and paresthesias to right lower extremity with prolonged standing and walking. I obtained lumbar radiographs in the office today that show multi level degenerative changes, bridging anterior osteophytes, no spondylolisthesis. We discussed treatment plan in detail today. Next step is to obtain lumbar MRI imaging. I explained to patient that MRI imaging will allow us  better look at central canal and surrounding soft tissues. Depending on results of MRI imaging we discussed possibility of performing lumbar epidural steroid injection. He has no questions at this time. I will see him back for lumbar MRI review. No red flag symptoms noted upon exam today.     Meds & Orders: No orders of the defined types were placed in this encounter.   Orders Placed This Encounter  Procedures   XR Lumbar Spine 2-3 Views   MR LUMBAR SPINE WO CONTRAST    Follow-up: Return for Lumbar MRI review.   Procedures: No procedures performed      Clinical History: No specialty comments available.   He reports that he has quit smoking. His smoking use included cigarettes. He has a 20 pack-year smoking history. He has never used smokeless tobacco. No results for input(s): HGBA1C, LABURIC in the last 8760 hours.  Objective:  VS:  HT:    WT:   BMI:     BP:   HR: bpm  TEMP: ( )  RESP:  Physical Exam Vitals and nursing note reviewed.  HENT:     Head: Normocephalic and atraumatic.     Right Ear: External ear normal.     Left Ear: External ear normal.     Nose: Nose normal.     Mouth/Throat:     Mouth: Mucous membranes are moist.  Eyes:     Extraocular Movements: Extraocular movements intact.  Cardiovascular:     Rate and Rhythm: Normal rate.     Pulses: Normal pulses.  Pulmonary:     Effort: Pulmonary effort is  normal.  Abdominal:     General: Abdomen is flat. There is no distension.  Musculoskeletal:        General: Tenderness present.     Cervical back: Normal range of motion.     Comments: Patient is slow to rise from seated position to standing. Good lumbar range of motion. No pain noted with facet loading. 5/5 strength noted with bilateral hip flexion, knee flexion/extension, ankle dorsiflexion/plantarflexion and EHL. No clonus noted bilaterally. Bilateral lower leg edema noted. No pain upon palpation of greater trochanters. No pain with internal/external rotation of bilateral hips. Decreased sensation to right lower leg compared to left. Negative slump test bilaterally. Ambulates without aid, gait steady.     Skin:    General: Skin is warm.     Capillary Refill: Capillary refill takes less than 2 seconds.  Neurological:     General: No focal deficit present.     Mental Status: He is alert and oriented to person, place, and time.  Psychiatric:        Mood and Affect: Mood normal.        Behavior: Behavior normal.     Ortho Exam  Imaging: XR Lumbar Spine 2-3 Views Result Date: 02/18/2024 AP and lateral radiographs of lumbar spine show normal segmentation and alignment. There are advanced multi level degenerative changes with disc height loss and bridging osteophytes. No spondylolisthesis. No fractures or dislocations.   Past Medical/Family/Surgical/Social History: Medications & Allergies reviewed per EMR, new medications updated. Patient Active Problem List   Diagnosis Date Noted   Peripheral artery disease (HCC) 12/06/2023   Acute pain of right shoulder 12/06/2023   Plantar fasciitis 12/06/2023   Hypotension due to hypovolemia 09/19/2023   Paroxysmal atrial fibrillation (HCC) 09/14/2020   Chronic heart failure with preserved ejection fraction (HFpEF) (HCC) 09/14/2020   Obesity (BMI 30-39.9) 06/24/2020   Past Medical History:  Diagnosis Date   Hypertension    Obesity (BMI  30-39.9) 06/24/2020   Peripheral vascular disease (HCC)    History reviewed. No pertinent family history. Past Surgical History:  Procedure Laterality Date   CARDIOVERSION N/A 09/19/2020   Procedure: CARDIOVERSION;  Surgeon: Pietro Redell RAMAN, MD;  Location[facility] ENDOSCOPY;  Service: Cardiovascular;  Laterality: N/A;   TEE WITHOUT CARDIOVERSION N/A 09/19/2020   Procedure: TRANSESOPHAGEAL ECHOCARDIOGRAM (TEE);  Surgeon: Pietro Redell RAMAN, MD;  Location: [facility] ENDOSCOPY;  Service: Cardiovascular;  Laterality: N/A;   TONSILLECTOMY     Social History   Occupational History   Not on file  Tobacco Use   Smoking status: Former    Current packs/day: 0.50    Average packs/day: 0.5 packs/day for 40.0 years (20.0 ttl pk-yrs)    Types: Cigarettes   Smokeless tobacco: Never  Vaping Use   Vaping status: Never Used  Substance and Sexual Activity   Alcohol use: Yes   Drug use: Never   Sexual activity: Not Currently

## 2024-02-18 NOTE — Progress Notes (Unsigned)
 Office Note     CC: Right lower extremity heaviness, concern for claudication, bilateral lower extremity edema Requesting Provider:  Chandra Toribio POUR, MD  HPI: Ronald [name] is a 83 y.o. ([birth year]) male presenting in follow-up with a myriad of vascular problems.    In short, the patient complains of right lower extremity claudication symptoms with bilateral lower extremity pitting edema, lymphedema, and concern for chronic venous insufficiency.  He presents today to discuss recent right lower extremity venous duplex to assess for chronic venous insufficiency.  On exam, Ronald [name] was doing well, accompanied by his wife.  A native of [place] , he moved to [place] decades ago.  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  He and his wife live in [facility].  Ronald [name] has had bilateral lower extremity edema for over 50 years.  He stated over the last 3 years, it has gotten significantly worse.  States the majority of the edema is located above the level of the ankle, and involves bilateral calves.  This causes his legs to feel very heavy.  Edema has been so severe he has had skin breaks, with oozing.  He has worn compression in the past, but states that it does not work.  He is not interested in wearing it again.  He does not elevate on a regular basis.    The right leg especially feels like he is dragging around a lead boot after ambulating roughly 50 yards.  He states he can walk through this, but usually stops and lets pain in his thigh past prior to continuing.  If he has a shopping cart, he can walk for an endless amount of time.  He is convinced that he has atherosclerotic disease in the right lower extremity causing symptoms of claudication.  Patient uses cilostazol  and states he can walk further with it.  At the time of his last visit, I have referred him to orthopedic spine due to concern for some neurogenic claudication.  He is not interested in getting an MRI,  therefore does not plan on returning.  Denies symptoms of ischemic rest pain, tissue loss    Past Medical History:  Diagnosis Date   Hypertension    Obesity (BMI 30-39.9) 06/24/2020   Peripheral vascular disease [facility])     Past Surgical History:  Procedure Laterality Date   CARDIOVERSION N/A 09/19/2020   Procedure: CARDIOVERSION;  Surgeon: Pietro Redell RAMAN, MD;  Location: [facility] ENDOSCOPY;  Service: Cardiovascular;  Laterality: N/A;   TEE WITHOUT CARDIOVERSION N/A 09/19/2020   Procedure: TRANSESOPHAGEAL ECHOCARDIOGRAM (TEE);  Surgeon: Pietro Redell RAMAN, MD;  Location: [facility] ENDOSCOPY;  Service: Cardiovascular;  Laterality: N/A;   TONSILLECTOMY      Social History   Socioeconomic History   Marital status: Married    Spouse name: Not on file   Number of children: Not on file   Years of education: Not on file   Highest education level: Master's degree (e.g., MA, MS, MEng, MEd, MSW, MBA)  Occupational History   Not on file  Tobacco Use   Smoking status: Former    Current packs/day: 0.50    Average packs/day: 0.5 packs/day for 40.0 years (20.0 ttl pk-yrs)    Types: Cigarettes   Smokeless tobacco: Never  Vaping Use   Vaping status: Never Used  Substance and Sexual Activity   Alcohol use: Yes   Drug use: Never   Sexual activity: Not Currently  Other Topics Concern   Not on file  Social History Narrative  Not on file   Social Drivers of Health   Financial Resource Strain: Low Risk  (02/04/2024)   Overall Financial Resource Strain (CARDIA)    Difficulty of Paying Living Expenses: Not very hard  Food Insecurity: No Food Insecurity (02/04/2024)   Hunger Vital Sign    Worried About Running Out of Food in the Last Year: Never true    Ran Out of Food in the Last Year: Never true  Transportation Needs: Unmet Transportation Needs (02/04/2024)   PRAPARE - Transportation    Lack of Transportation (Medical): No    Lack of Transportation (Non-Medical): Yes  Physical Activity: Inactive  (02/04/2024)   Exercise Vital Sign    Days of Exercise per Week: 0 days    Minutes of Exercise per Session: 0 min  Stress: Stress Concern Present (02/04/2024)   [employer] of Occupational Health - Occupational Stress Questionnaire    Feeling of Stress: To some extent  Social Connections: Socially Isolated (02/04/2024)   Social Connection and Isolation Panel    Frequency of Communication with Friends and Family: Once a week    Frequency of Social Gatherings with Friends and Family: Once a week    Attends Religious Services: Never    Database administrator or Organizations: No    Attends Engineer, structural: Not on file    Marital Status: Married  Catering manager Violence: Not At Risk (02/06/2024)   Humiliation, Afraid, Rape, and Kick questionnaire    Fear of Current or Ex-Partner: No    Emotionally Abused: No    Physically Abused: No    Sexually Abused: No   No family history on file.  Current Outpatient Medications  Medication Sig Dispense Refill   apixaban  (ELIQUIS ) 5 MG TABS tablet Take 1 tablet (5 mg total) by mouth 2 (two) times daily. 180 tablet 1   brimonidine -timolol  (COMBIGAN ) 0.2-0.5 % ophthalmic solution Place 1 drop into both eyes every 12 (twelve) hours.     cilostazol  (PLETAL ) 100 MG tablet Take 1 tablet (100 mg total) by mouth 2 (two) times daily. 180 tablet 3   clobetasol cream (TEMOVATE) 0.05 % Apply 1 application topically daily as needed (rash).     furosemide  (LASIX ) 80 MG tablet Take 1 tablet (80 mg total) by mouth daily. 90 tablet 3   Multiple Vitamin (MULTIVITAMIN) tablet Take 1 tablet by mouth daily.     Multiple Vitamins-Minerals (PRESERVISION AREDS 2) CAPS Take 1 capsule by mouth in the morning and at bedtime.     Omega 3 1200 MG CAPS Take 1,200 mg by mouth daily.     spironolactone  (ALDACTONE ) 25 MG tablet Take 0.5 tablets (12.5 mg total) by mouth daily. 45 tablet 3   vitamin B-12 (CYANOCOBALAMIN) 1000 MCG tablet Take 1,000 mcg by mouth daily.      No current facility-administered medications for this visit.    No Known Allergies   REVIEW OF SYSTEMS:   [X]  denotes positive finding, [ ]  denotes negative finding Cardiac  Comments:  Chest pain or chest pressure:    Shortness of breath upon exertion:    Short of breath when lying flat:    Irregular heart rhythm:        Vascular    Pain in calf, thigh, or hip brought on by ambulation:    Pain in feet at night that wakes you up from your sleep:     Blood clot in your veins:    Leg swelling:  Pulmonary    Oxygen at home:    Productive cough:     Wheezing:         Neurologic    Sudden weakness in arms or legs:     Sudden numbness in arms or legs:     Sudden onset of difficulty speaking or slurred speech:    Temporary loss of vision in one eye:     Problems with dizziness:         Gastrointestinal    Blood in stool:     Vomited blood:         Genitourinary    Burning when urinating:     Blood in urine:        Psychiatric    Major depression:         Hematologic    Bleeding problems:    Problems with blood clotting too easily:        Skin    Rashes or ulcers:        Constitutional    Fever or chills:      PHYSICAL EXAMINATION:  There were no vitals filed for this visit.  General:  WDWN in NAD; vital signs documented above Gait: Not observed HENT: WNL, normocephalic Pulmonary: normal non-labored breathing , without wheezing Cardiac: regular HR Abdomen: soft, NT, no masses Skin: without rashes Vascular Exam/Pulses:  Right Left  Radial 2+ (normal) 2+ (normal)  Ulnar    Femoral    Popliteal    DP 2+ (normal) 2+ (normal)  PT     Extremities: without ischemic changes, without Gangrene , without cellulitis; without open wounds;  Musculoskeletal: no muscle wasting or atrophy  Neurologic: A&O X 3;  No focal weakness or paresthesias are detected Psychiatric:  The pt has Normal affect.   Non-Invasive Vascular Imaging:    ABI Findings:   +---------+------------------+-----+---------+--------+  Right   Rt Pressure (mmHg)IndexWaveform Comment   +---------+------------------+-----+---------+--------+  Brachial 142                    triphasic          +---------+------------------+-----+---------+--------+  PTA     148               1.04 biphasic           +---------+------------------+-----+---------+--------+  DP      150               1.06 triphasic          +---------+------------------+-----+---------+--------+  Great Toe116               0.82                    +---------+------------------+-----+---------+--------+   +---------+------------------+-----+-----------+-------+  Left    Lt Pressure (mmHg)IndexWaveform   Comment  +---------+------------------+-----+-----------+-------+  Brachial 135                    triphasic           +---------+------------------+-----+-----------+-------+  PTA     254               1.79 multiphasic         +---------+------------------+-----+-----------+-------+  DP      145               1.02 triphasic           +---------+------------------+-----+-----------+-------+  Burnetta Spade  0.93                     +---------+------------------+-----+-----------+-------+   +-------+-----------+-----------+------------+------------+  ABI/TBIToday's ABIToday's TBIPrevious ABIPrevious TBI  +-------+-----------+-----------+------------+------------+  Right 1.06       0.82       1.31        1.05          +-------+-----------+-----------+------------+------------+  Left  1.02       0.93       0.98        0.84          +-------+-----------+-----------+------------+------------+      ASSESSMENT/PLAN: Numan Zylstra is a 83 y.o. male presenting with chronic bilateral lower extremity edema and concern for right lower extremity claudication.  My discussion with Ronald [name] went in circles.  I did give  suggestions, to which we will would either accept or deny, trying to dictate the outcome of his care.  In short, it seems that Ronald [name] is most focused on right lower extremity claudication symptoms.  His ABI was normal.  He has a palpable pulse in the foot.  He is convinced that he has claudication from peripheral arterial disease, and has a previous arterial duplex ultrasound that demonstrated some disease at the level of the popliteal artery.  This is incongruent with his symptoms, which he complains of pain in the thigh.  Although he has a palpable pulse in the groin, the only explanation from a peripheral vascular standpoint would be inflow disease.  He is aware with the mild claudication symptoms he is describing, I would not offer any therapy, and would ask him to continue on cilostazol .  I offered him an arterial duplex ultrasound of the right lower extremity, but he stated he does not want to come back for an ultrasound.  Regarding bilateral lower extremity edema, I think this is multifactorial with both lymphedema and chronic venous insufficiency.  His most recent right sided venous reflux study demonstrated venous insufficiency.  The vein is sizable, and could be ablated.  I discussed this with him at length, but stated that if he does have peripheral arterial disease, ablating this vein may handcuff us  in the future if he requires a bypass of some sort.  I think it is reasonable to further workup his peripheral arterial disease to define if there is any-on physical exam, he has a palpable pulse and ABI was normal, however prior study demonstrated some concern.  He does not want an ultrasound.  The only other study I could offer would be a CT scan abdomen pelvis with runoff.  Bill excepted this offer, with the understanding that even if he has peripheral arterial disease are not going to treat it.  The reason for moving forward with CTA, and define the level of peripheral arterial disease is to see if  we could move forward with venous ablation to hopefully improve lower extremity edema.  He is aware that prior to any of this, he will need to wear compression stockings for 3 months, and elevate when able.  He states none of it has worked in the past.  He is aware that is all I can offer at this time.  He is also aware that venous ablation will not resolve his lower extremity edema, and symptoms such as heaviness as he has lymphedema, but but may help, and that it is the only intervention that can be offered if PAD is minimal.  My plan is to call him after  the CT studies.    Fonda FORBES Rim, MD [facility] [phone] Total time of patient care including pre-visit research, consultation, and documentation greater than 40 minutes

## 2024-02-20 ENCOUNTER — Ambulatory Visit (HOSPITAL_COMMUNITY)
Admission: RE | Admit: 2024-02-20 | Discharge: 2024-02-20 | Disposition: A | Discharge status: Home / Self Care | Source: Ambulatory Visit | Attending: Vascular Surgery | Admitting: Vascular Surgery

## 2024-02-20 ENCOUNTER — Ambulatory Visit (INDEPENDENT_AMBULATORY_CARE_PROVIDER_SITE_OTHER): Admitting: Vascular Surgery

## 2024-02-20 ENCOUNTER — Encounter: Payer: Self-pay | Admitting: Vascular Surgery

## 2024-02-20 VITALS — BP 119/63 | HR 75 | Temp 98.2°F | Resp 20 | Ht 70.0 in | Wt 243.4 lb

## 2024-02-20 DIAGNOSIS — I872 Venous insufficiency (chronic) (peripheral): Secondary | ICD-10-CM | POA: Insufficient documentation

## 2024-02-20 DIAGNOSIS — M7989 Other specified soft tissue disorders: Secondary | ICD-10-CM | POA: Diagnosis present

## 2024-02-20 DIAGNOSIS — I890 Lymphedema, not elsewhere classified: Secondary | ICD-10-CM

## 2024-02-20 DIAGNOSIS — I739 Peripheral vascular disease, unspecified: Secondary | ICD-10-CM

## 2024-02-21 ENCOUNTER — Encounter: Payer: Self-pay | Admitting: Vascular Surgery

## 2024-02-21 ENCOUNTER — Encounter: Payer: Self-pay | Admitting: Family Medicine

## 2024-02-24 ENCOUNTER — Other Ambulatory Visit: Payer: Self-pay | Admitting: Family Medicine

## 2024-02-24 ENCOUNTER — Other Ambulatory Visit: Payer: Self-pay

## 2024-02-24 DIAGNOSIS — B351 Tinea unguium: Secondary | ICD-10-CM

## 2024-02-24 DIAGNOSIS — M722 Plantar fascial fibromatosis: Secondary | ICD-10-CM

## 2024-02-24 DIAGNOSIS — I739 Peripheral vascular disease, unspecified: Secondary | ICD-10-CM

## 2024-03-03 ENCOUNTER — Ambulatory Visit: Admitting: Podiatry

## 2024-03-03 DIAGNOSIS — M722 Plantar fascial fibromatosis: Secondary | ICD-10-CM

## 2024-03-06 ENCOUNTER — Telehealth: Admitting: Family Medicine

## 2024-03-06 ENCOUNTER — Ambulatory Visit (INDEPENDENT_AMBULATORY_CARE_PROVIDER_SITE_OTHER): Admitting: Podiatry

## 2024-03-06 ENCOUNTER — Encounter: Payer: Self-pay | Admitting: Podiatry

## 2024-03-06 DIAGNOSIS — M722 Plantar fascial fibromatosis: Secondary | ICD-10-CM

## 2024-03-06 DIAGNOSIS — M79675 Pain in left toe(s): Secondary | ICD-10-CM

## 2024-03-06 DIAGNOSIS — M7661 Achilles tendinitis, right leg: Secondary | ICD-10-CM

## 2024-03-06 DIAGNOSIS — R600 Localized edema: Secondary | ICD-10-CM

## 2024-03-06 DIAGNOSIS — M79674 Pain in right toe(s): Secondary | ICD-10-CM

## 2024-03-06 DIAGNOSIS — B351 Tinea unguium: Secondary | ICD-10-CM | POA: Diagnosis not present

## 2024-03-06 DIAGNOSIS — I739 Peripheral vascular disease, unspecified: Secondary | ICD-10-CM

## 2024-03-06 DIAGNOSIS — M7989 Other specified soft tissue disorders: Secondary | ICD-10-CM

## 2024-03-06 MED ORDER — TRIAMCINOLONE ACETONIDE 10 MG/ML IJ SUSP
10.0000 mg | Freq: Once | INTRAMUSCULAR | Status: AC
  Administered 2024-03-06: 10 mg via INTRA_ARTICULAR

## 2024-03-06 NOTE — Assessment & Plan Note (Addendum)
 History of venous insufficiency and lymphedema, seen by vascular surgery. Tolerating compression stockings better now that he has modified them at the toe - Continue wearing compression stockings as tolerated for 3 months. - Advised on availability of open-toe compression stockings. - further treatment would depend on results of upcoming CT scan

## 2024-03-06 NOTE — Assessment & Plan Note (Addendum)
 Discussed treatment options with vascular surgery.  Given the mild degree, plan would be to continue treating medically.  CT angiogram of lower extremities ordered by vascular surgery. Appointment with vascular surgery is scheduled for 03/26/2024. - Continue Cilostazol . - [facility] to schedule the CT angiogram prior to the 03/26/2024 appointment.

## 2024-03-06 NOTE — Progress Notes (Unsigned)
   Established Patient Office Visit  Subjective   Patient ID: Ronald [name], male    DOB: 02/20/1941  Age: 83 y.o. MRN: [id number]  Chief Complaint  Patient presents with   Medical Management of Chronic Issues    HPI Pt location: home Provider location: [facility] Method: video attempted, unable to hear audio, so switched to phone Duration: 20 min  Subjective - Follow-up with vascular surgery for venous insufficiency. Recommended vein ablation, but requires 3 months of compression sock use prior. Having difficulty tolerating compression socks due to toe pain from compression. Has modified socks by cutting out the toe area. Was advised that open-toe compression socks are available. - Follow-up on peripheral arterial disease. A CT angiogram of the lower extremities was ordered by the vascular surgeon. The scan is to be done prior to a follow-up appointment on 03/26/2024. Patient has not yet been contacted for scheduling. Was advised to [facility] to schedule the imaging. - Podiatry appointment scheduled for today was a reschedule from a canceled appointment last week. - No other acute issues or concerns.  Medications: Cilostazol  (Pletal ) for claudication, no changes.  PMH, PSH, FH, Social Hx: Venous insufficiency. Peripheral arterial disease with claudication.  ROS: Lower Extremities: Reports no foot swelling. Reports toe pain with compression socks due to toes being pushed together. No other issues.   The ASCVD Risk score (Arnett DK, et al., 2019) failed to calculate for the following reasons:   The 2019 ASCVD risk score is only valid for ages 46 to 41  Health Maintenance Due  Topic Date Due   Influenza Vaccine  01/03/2024   COVID-19 Vaccine (8 - Pfizer risk 2024-25 season) 02/03/2024      Objective:     There were no vitals taken for this visit. {Vitals History (Optional):23777}  Physical Exam Gen: alert, oriented Pulm: able to  speak in full sentences without difficulty   No results found for any visits on 03/06/24.      Assessment & Plan:   Lower extremity edema Assessment & Plan: History of venous insufficiency, seen by vascular surgery. Recommended for vein ablation. Tolerating compression stockings poorly due to toe pain but has modified them. - Continue wearing compression stockings as tolerated for 3 months. - Advised on availability of open-toe compression stockings. - Proceed with vein ablation after trial of compression therapy.   Peripheral artery disease Assessment & Plan: History of PAD with claudication. CT angiogram of lower extremities ordered by vascular surgery. Appointment with vascular surgery is scheduled for 03/26/2024. - Continue Cilostazol . - Advised to [facility] to schedule the CT angiogram prior to the 03/26/2024 appointment.      No follow-ups on file.    Toribio MARLA Slain, MD

## 2024-03-09 NOTE — Progress Notes (Signed)
 Subjective:   Patient ID: Ronald [name], male   DOB: 83 y.o.   MRN: [id number]   HPI Patient presents stating he has been having quite a bit of pain in the back of his right heel and he has severely elongated nailbeds of both feet that no one to cut for him and it has been probably a year since they have been done.  Patient states that he is not a smoker currently and not a diabetic   Review of Systems  All other systems reviewed and are negative.       Objective:  Physical Exam Vitals and nursing note reviewed.  Constitutional:      Appearance: He is well-developed.  Pulmonary:     Effort: Pulmonary effort is normal.  Musculoskeletal:        General: Normal range of motion.  Skin:    General: Skin is warm.  Neurological:     Mental Status: He is alert.     Vascular status found to be mildly diminished but intact bilateral with patient noted to have mild diminishment sharp dull vibratory.  Patient has quite a bit of edema in both legs +2 pitting which is most likely related to chronic heart failure and patient has quite a bit of discomfort medial side of the right Achilles tendon insertion and also mild plantar fasciitis.  Nails are severely thickened with discomfort and pressure occurring against ball beds and patient does have good digital perfusion well-oriented     Assessment:  Achilles tendinitis right chronic edema with probable venous insufficiency bilateral and severe mycotic nail infection with pain 1-5 both feet     Plan:  H&P reviewed condition.  I want to try to help with the pain he is getting in the Achilles and I did explain careful injection and the chances for rupture.  He wants to do this and I just did the medial side 3 mg dexamethasone Kenalog  5 mg Xylocaine  stayed away central and lateral and applied sterile dressing.  I then debrided nailbeds 1-5 both feet no iatrogenic bleeding reappoint routine care

## 2024-03-20 ENCOUNTER — Ambulatory Visit (HOSPITAL_COMMUNITY)

## 2024-03-24 NOTE — Addendum Note (Signed)
 Addended by: RAYNA MOATS A on: 03/24/2024 10:56 AM   Modules accepted: Orders

## 2024-03-26 ENCOUNTER — Ambulatory Visit (INDEPENDENT_AMBULATORY_CARE_PROVIDER_SITE_OTHER): Admitting: Vascular Surgery

## 2024-03-27 ENCOUNTER — Ambulatory Visit (HOSPITAL_COMMUNITY)
Admission: RE | Admit: 2024-03-27 | Discharge: 2024-03-27 | Disposition: A | Discharge status: Home / Self Care | Source: Ambulatory Visit | Attending: Vascular Surgery | Admitting: Vascular Surgery

## 2024-03-27 DIAGNOSIS — I739 Peripheral vascular disease, unspecified: Secondary | ICD-10-CM | POA: Diagnosis present

## 2024-03-27 DIAGNOSIS — M7989 Other specified soft tissue disorders: Secondary | ICD-10-CM

## 2024-03-27 MED ORDER — IOHEXOL 350 MG/ML SOLN
100.0000 mL | Freq: Once | INTRAVENOUS | Status: AC | PRN
  Administered 2024-03-27: 100 mL via INTRAVENOUS

## 2024-04-01 ENCOUNTER — Ambulatory Visit: Attending: Vascular Surgery | Admitting: Vascular Surgery

## 2024-04-01 DIAGNOSIS — I872 Venous insufficiency (chronic) (peripheral): Secondary | ICD-10-CM | POA: Insufficient documentation

## 2024-04-01 DIAGNOSIS — M7989 Other specified soft tissue disorders: Secondary | ICD-10-CM | POA: Diagnosis present

## 2024-04-01 NOTE — Progress Notes (Signed)
 Office Note     CC: Right lower extremity heaviness, concern for claudication, bilateral lower extremity edema Requesting Provider:  Chandra Toribio POUR, MD  HPI: Ronald [name] is a 83 y.o. (March 05, 1941) male presenting in follow-up via phone call with a myriad of vascular problems.    At his last visit we discussed that the best way to assess for claudication with normal ABI, palpable pulse in the groin is CT scan to ensure that he does not have inflow disease.  During our phone call today, [name] was doing well.  He was at home.  States right leg heaviness and thigh pain have improved with the use of compression stockings.  A native of [place] , he moved to [place] decades ago.  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  He and his wife live in [facility]  Denies symptoms of ischemic rest pain, tissue loss    Past Medical History:  Diagnosis Date   Hypertension    Obesity (BMI 30-39.9) 06/24/2020   Peripheral vascular disease     Past Surgical History:  Procedure Laterality Date   CARDIOVERSION N/A 09/19/2020   Procedure: CARDIOVERSION;  Surgeon: Pietro Redell RAMAN, MD;  Location: [facility] ENDOSCOPY;  Service: Cardiovascular;  Laterality: N/A;   TEE WITHOUT CARDIOVERSION N/A 09/19/2020   Procedure: TRANSESOPHAGEAL ECHOCARDIOGRAM (TEE);  Surgeon: Pietro Redell RAMAN, MD;  Location: [facility] ENDOSCOPY;  Service: Cardiovascular;  Laterality: N/A;   TONSILLECTOMY      Social History   Socioeconomic History   Marital status: Married    Spouse name: Not on file   Number of children: Not on file   Years of education: Not on file   Highest education level: Master's degree (e.g., MA, MS, MEng, MEd, MSW, MBA)  Occupational History   Not on file  Tobacco Use   Smoking status: Former    Current packs/day: 0.50    Average packs/day: 0.5 packs/day for 40.0 years (20.0 ttl pk-yrs)    Types: Cigarettes   Smokeless tobacco: Never  Vaping Use   Vaping status: Never  Used  Substance and Sexual Activity   Alcohol use: Yes   Drug use: Never   Sexual activity: Not Currently  Other Topics Concern   Not on file  Social History Narrative   Not on file   Social Drivers of Health   Financial Resource Strain: Low Risk  (02/04/2024)   Overall Financial Resource Strain (CARDIA)    Difficulty of Paying Living Expenses: Not very hard  Food Insecurity: No Food Insecurity (02/04/2024)   Hunger Vital Sign    Worried About Running Out of Food in the Last Year: Never true    Ran Out of Food in the Last Year: Never true  Transportation Needs: Unmet Transportation Needs (02/04/2024)   PRAPARE - Transportation    Lack of Transportation (Medical): No    Lack of Transportation (Non-Medical): Yes  Physical Activity: Inactive (02/04/2024)   Exercise Vital Sign    Days of Exercise per Week: 0 days    Minutes of Exercise per Session: 0 min  Stress: Stress Concern Present (02/04/2024)   [employer] of Occupational Health - Occupational Stress Questionnaire    Feeling of Stress: To some extent  Social Connections: Socially Isolated (02/04/2024)   Social Connection and Isolation Panel    Frequency of Communication with Friends and Family: Once a week    Frequency of Social Gatherings with Friends and Family: Once a week    Attends Religious Services: Never  Active Member of Clubs or Organizations: No    Attends Banker Meetings: Not on file    Marital Status: Married  Intimate Partner Violence: Not At Risk (02/06/2024)   Humiliation, Afraid, Rape, and Kick questionnaire    Fear of Current or Ex-Partner: No    Emotionally Abused: No    Physically Abused: No    Sexually Abused: No   No family history on file.  Current Outpatient Medications  Medication Sig Dispense Refill   apixaban  (ELIQUIS ) 5 MG TABS tablet Take 1 tablet (5 mg total) by mouth 2 (two) times daily. 180 tablet 1   brimonidine -timolol  (COMBIGAN ) 0.2-0.5 % ophthalmic solution Place 1 drop  into both eyes every 12 (twelve) hours.     cilostazol  (PLETAL ) 100 MG tablet Take 1 tablet (100 mg total) by mouth 2 (two) times daily. 180 tablet 3   clobetasol cream (TEMOVATE) 0.05 % Apply 1 application topically daily as needed (rash).     furosemide  (LASIX ) 80 MG tablet Take 1 tablet (80 mg total) by mouth daily. 90 tablet 3   Multiple Vitamin (MULTIVITAMIN) tablet Take 1 tablet by mouth daily.     Multiple Vitamins-Minerals (PRESERVISION AREDS 2) CAPS Take 1 capsule by mouth in the morning and at bedtime.     Omega 3 1200 MG CAPS Take 1,200 mg by mouth daily.     spironolactone  (ALDACTONE ) 25 MG tablet Take 0.5 tablets (12.5 mg total) by mouth daily. 45 tablet 3   vitamin B-12 (CYANOCOBALAMIN) 1000 MCG tablet Take 1,000 mcg by mouth daily.     No current facility-administered medications for this visit.    No Known Allergies   REVIEW OF SYSTEMS:   [X]  denotes positive finding, [ ]  denotes negative finding Cardiac  Comments:  Chest pain or chest pressure:    Shortness of breath upon exertion:    Short of breath when lying flat:    Irregular heart rhythm:        Vascular    Pain in calf, thigh, or hip brought on by ambulation:    Pain in feet at night that wakes you up from your sleep:     Blood clot in your veins:    Leg swelling:         Pulmonary    Oxygen at home:    Productive cough:     Wheezing:         Neurologic    Sudden weakness in arms or legs:     Sudden numbness in arms or legs:     Sudden onset of difficulty speaking or slurred speech:    Temporary loss of vision in one eye:     Problems with dizziness:         Gastrointestinal    Blood in stool:     Vomited blood:         Genitourinary    Burning when urinating:     Blood in urine:        Psychiatric    Major depression:         Hematologic    Bleeding problems:    Problems with blood clotting too easily:        Skin    Rashes or ulcers:        Constitutional    Fever or chills:       PHYSICAL EXAMINATION:  There were no vitals filed for this visit.  General:  WDWN in NAD; vital signs documented above Gait:  Not observed HENT: WNL, normocephalic Pulmonary: normal non-labored breathing , without wheezing Cardiac: regular HR Abdomen: soft, NT, no masses Skin: without rashes Vascular Exam/Pulses:  Right Left  Radial 2+ (normal) 2+ (normal)  Ulnar    Femoral    Popliteal    DP 2+ (normal) 2+ (normal)  PT     Extremities: without ischemic changes, without Gangrene , without cellulitis; without open wounds;  Musculoskeletal: no muscle wasting or atrophy  Neurologic: A&O X 3;  No focal weakness or paresthesias are detected Psychiatric:  The pt has Normal affect.   Non-Invasive Vascular Imaging:    ABI Findings:  +---------+------------------+-----+---------+--------+  Right   Rt Pressure (mmHg)IndexWaveform Comment   +---------+------------------+-----+---------+--------+  Brachial 142                    triphasic          +---------+------------------+-----+---------+--------+  PTA     148               1.04 biphasic           +---------+------------------+-----+---------+--------+  DP      150               1.06 triphasic          +---------+------------------+-----+---------+--------+  Great Toe116               0.82                    +---------+------------------+-----+---------+--------+   +---------+------------------+-----+-----------+-------+  Left    Lt Pressure (mmHg)IndexWaveform   Comment  +---------+------------------+-----+-----------+-------+  Brachial 135                    triphasic           +---------+------------------+-----+-----------+-------+  PTA     254               1.79 multiphasic         +---------+------------------+-----+-----------+-------+  DP      145               1.02 triphasic           +---------+------------------+-----+-----------+-------+  Great Toe132                0.93                     +---------+------------------+-----+-----------+-------+   +-------+-----------+-----------+------------+------------+  ABI/TBIToday's ABIToday's TBIPrevious ABIPrevious TBI  +-------+-----------+-----------+------------+------------+  Right 1.06       0.82       1.31        1.05          +-------+-----------+-----------+------------+------------+  Left  1.02       0.93       0.98        0.84          +-------+-----------+-----------+------------+------------+      ASSESSMENT/PLAN: Zeeshan Korte is a 83 y.o. male presenting with chronic bilateral lower extremity edema and concern for right lower extremity claudication.  CTA was reviewed.  He has no appreciable vascular disease. He does have venous disease, which we discussed at length.  He is happy that symptoms have improved with the use of compression stockings.  I think that he would be a good candidate for venous ablation.  We discussed I would like for him to trial compression for 3 months, at which point we would see him back in the office.  Fonda FORBES Rim, MD [facility] [phone] Phone call time 12 minutes

## 2024-04-06 ENCOUNTER — Encounter: Payer: Self-pay | Admitting: Radiology

## 2024-04-16 ENCOUNTER — Other Ambulatory Visit: Payer: Self-pay | Admitting: Internal Medicine

## 2024-04-16 DIAGNOSIS — I480 Paroxysmal atrial fibrillation: Secondary | ICD-10-CM

## 2024-05-11 ENCOUNTER — Encounter (INDEPENDENT_AMBULATORY_CARE_PROVIDER_SITE_OTHER): Payer: Medicare Other | Admitting: Ophthalmology

## 2024-05-20 ENCOUNTER — Encounter (INDEPENDENT_AMBULATORY_CARE_PROVIDER_SITE_OTHER): Admitting: Ophthalmology

## 2024-05-20 ENCOUNTER — Encounter: Payer: Self-pay | Admitting: Internal Medicine

## 2024-05-20 ENCOUNTER — Other Ambulatory Visit: Payer: Self-pay | Admitting: Internal Medicine

## 2024-05-20 ENCOUNTER — Other Ambulatory Visit: Payer: Self-pay

## 2024-05-20 ENCOUNTER — Other Ambulatory Visit (HOSPITAL_COMMUNITY): Payer: Self-pay

## 2024-05-20 DIAGNOSIS — H35033 Hypertensive retinopathy, bilateral: Secondary | ICD-10-CM

## 2024-05-20 DIAGNOSIS — I10 Essential (primary) hypertension: Secondary | ICD-10-CM | POA: Diagnosis not present

## 2024-05-20 DIAGNOSIS — I480 Paroxysmal atrial fibrillation: Secondary | ICD-10-CM

## 2024-05-20 DIAGNOSIS — H43813 Vitreous degeneration, bilateral: Secondary | ICD-10-CM

## 2024-05-20 DIAGNOSIS — H353134 Nonexudative age-related macular degeneration, bilateral, advanced atrophic with subfoveal involvement: Secondary | ICD-10-CM | POA: Diagnosis not present

## 2024-05-20 MED ORDER — APIXABAN 5 MG PO TABS: 5.0000 mg | ORAL_TABLET | Freq: Two times a day (BID) | ORAL | 1 refills | Status: AC

## 2024-05-20 MED ORDER — APIXABAN 5 MG PO TABS
5.0000 mg | ORAL_TABLET | Freq: Two times a day (BID) | ORAL | 1 refills | Status: DC
  Filled 2024-05-20: qty 180, 90d supply, fill #0

## 2024-05-20 NOTE — Telephone Encounter (Signed)
 Eliquis  5mg  refill request received. Patient is 83 years old, weight-110.4kg, Crea-0.98 on 10/08/23, Diagnosis-Afib, and last seen by Dr. Loni on 09/26/23 via Tele Visit. Dose is appropriate based on dosing criteria. Will send in refill to requested pharmacy.

## 2024-05-20 NOTE — Telephone Encounter (Signed)
 Prescription refill request for Eliquis  received. Indication: afib Last office visit:09/26/2023 Video  Scr: 0.98 (10/08/23) Age: 83 Weight: 110.4kg  Dose appropriate, refill sent

## 2024-06-01 ENCOUNTER — Other Ambulatory Visit (HOSPITAL_COMMUNITY): Payer: Self-pay

## 2024-06-06 ENCOUNTER — Encounter: Payer: Self-pay | Admitting: Internal Medicine

## 2024-06-29 ENCOUNTER — Encounter: Payer: Self-pay | Admitting: Surgery

## 2024-07-03 ENCOUNTER — Encounter: Payer: Self-pay | Admitting: Surgery

## 2024-07-06 ENCOUNTER — Ambulatory Visit: Admitting: Surgery

## 2024-08-17 ENCOUNTER — Ambulatory Visit: Admitting: Surgery

## 2024-09-16 ENCOUNTER — Ambulatory Visit: Admitting: Internal Medicine

## 2025-04-01 ENCOUNTER — Ambulatory Visit

## 2025-05-20 ENCOUNTER — Encounter (INDEPENDENT_AMBULATORY_CARE_PROVIDER_SITE_OTHER): Admitting: Ophthalmology
